# Patient Record
Sex: Female | Born: 1985 | Race: White | Hispanic: No | Smoking: Former smoker
Health system: Southern US, Community
[De-identification: ages and names within clinical notes are randomized; demographics above are authoritative.]

## PROBLEM LIST (undated history)

## (undated) ENCOUNTER — Inpatient Hospital Stay (HOSPITAL_COMMUNITY): Payer: Self-pay

## (undated) DIAGNOSIS — F32A Depression, unspecified: Secondary | ICD-10-CM

## (undated) DIAGNOSIS — F1010 Alcohol abuse, uncomplicated: Secondary | ICD-10-CM

## (undated) DIAGNOSIS — F419 Anxiety disorder, unspecified: Secondary | ICD-10-CM

## (undated) DIAGNOSIS — R87629 Unspecified abnormal cytological findings in specimens from vagina: Secondary | ICD-10-CM

## (undated) DIAGNOSIS — B9689 Other specified bacterial agents as the cause of diseases classified elsewhere: Secondary | ICD-10-CM

## (undated) DIAGNOSIS — F1910 Other psychoactive substance abuse, uncomplicated: Secondary | ICD-10-CM

## (undated) DIAGNOSIS — F329 Major depressive disorder, single episode, unspecified: Secondary | ICD-10-CM

## (undated) DIAGNOSIS — N390 Urinary tract infection, site not specified: Secondary | ICD-10-CM

## (undated) DIAGNOSIS — N760 Acute vaginitis: Secondary | ICD-10-CM

## (undated) HISTORY — PX: COLONOSCOPY: SHX174

## (undated) HISTORY — DX: Depression, unspecified: F32.A

## (undated) HISTORY — DX: Urinary tract infection, site not specified: N39.0

## (undated) HISTORY — PX: OTHER SURGICAL HISTORY: SHX169

## (undated) HISTORY — PX: WISDOM TOOTH EXTRACTION: SHX21

## (undated) HISTORY — DX: Major depressive disorder, single episode, unspecified: F32.9

## (undated) HISTORY — DX: Acute vaginitis: N76.0

## (undated) HISTORY — DX: Anxiety disorder, unspecified: F41.9

## (undated) HISTORY — DX: Unspecified abnormal cytological findings in specimens from vagina: R87.629

## (undated) HISTORY — DX: Other specified bacterial agents as the cause of diseases classified elsewhere: B96.89

---
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
## 2007-08-06 ENCOUNTER — Other Ambulatory Visit: Admission: RE | Admit: 2007-08-06 | Discharge: 2007-08-06 | Payer: Self-pay | Admitting: Gynecology

## 2008-03-08 ENCOUNTER — Other Ambulatory Visit: Admission: RE | Admit: 2008-03-08 | Discharge: 2008-03-08 | Payer: Self-pay | Admitting: Gynecology

## 2008-03-08 ENCOUNTER — Ambulatory Visit: Payer: Self-pay | Admitting: Gynecology

## 2008-03-08 ENCOUNTER — Encounter: Payer: Self-pay | Admitting: Gynecology

## 2008-04-14 ENCOUNTER — Emergency Department (HOSPITAL_COMMUNITY): Admission: EM | Admit: 2008-04-14 | Discharge: 2008-04-14 | Payer: Self-pay | Admitting: Emergency Medicine

## 2008-07-13 ENCOUNTER — Ambulatory Visit: Payer: Self-pay | Admitting: Gynecology

## 2010-06-18 LAB — URINALYSIS, ROUTINE W REFLEX MICROSCOPIC
Bilirubin Urine: NEGATIVE
Glucose, UA: NEGATIVE mg/dL
Hgb urine dipstick: NEGATIVE
Ketones, ur: NEGATIVE mg/dL
Nitrite: NEGATIVE
Protein, ur: NEGATIVE mg/dL
Specific Gravity, Urine: 1 — ABNORMAL LOW (ref 1.005–1.030)
Urobilinogen, UA: 0.2 mg/dL (ref 0.0–1.0)
pH: 6 (ref 5.0–8.0)

## 2010-06-18 LAB — RAPID URINE DRUG SCREEN, HOSP PERFORMED
Amphetamines: NOT DETECTED
Barbiturates: NOT DETECTED
Benzodiazepines: NOT DETECTED
Cocaine: NOT DETECTED
Opiates: NOT DETECTED
Tetrahydrocannabinol: NOT DETECTED

## 2010-06-18 LAB — CBC
HCT: 41.7 % (ref 36.0–46.0)
Hemoglobin: 14.3 g/dL (ref 12.0–15.0)
MCHC: 34.2 g/dL (ref 30.0–36.0)
MCV: 90.6 fL (ref 78.0–100.0)
Platelets: 177 10*3/uL (ref 150–400)
RBC: 4.61 MIL/uL (ref 3.87–5.11)
RDW: 12 % (ref 11.5–15.5)
WBC: 6.9 10*3/uL (ref 4.0–10.5)

## 2010-06-18 LAB — POCT I-STAT, CHEM 8
BUN: 9 mg/dL (ref 6–23)
Calcium, Ion: 0.95 mmol/L — ABNORMAL LOW (ref 1.12–1.32)
Chloride: 113 mEq/L — ABNORMAL HIGH (ref 96–112)
Creatinine, Ser: 0.8 mg/dL (ref 0.4–1.2)
Glucose, Bld: 100 mg/dL — ABNORMAL HIGH (ref 70–99)
HCT: 42 % (ref 36.0–46.0)
Hemoglobin: 14.3 g/dL (ref 12.0–15.0)
Potassium: 4.7 mEq/L (ref 3.5–5.1)
Sodium: 142 mEq/L (ref 135–145)
TCO2: 16 mmol/L (ref 0–100)

## 2010-06-18 LAB — DIFFERENTIAL
Basophils Absolute: 0 10*3/uL (ref 0.0–0.1)
Basophils Relative: 1 % (ref 0–1)
Eosinophils Absolute: 0 10*3/uL (ref 0.0–0.7)
Eosinophils Relative: 1 % (ref 0–5)
Lymphocytes Relative: 37 % (ref 12–46)
Lymphs Abs: 2.5 10*3/uL (ref 0.7–4.0)
Monocytes Absolute: 0.5 10*3/uL (ref 0.1–1.0)
Monocytes Relative: 8 % (ref 3–12)
Neutro Abs: 3.7 10*3/uL (ref 1.7–7.7)
Neutrophils Relative %: 54 % (ref 43–77)

## 2010-06-18 LAB — URINE MICROSCOPIC-ADD ON

## 2010-06-18 LAB — POCT PREGNANCY, URINE: Preg Test, Ur: NEGATIVE

## 2010-06-18 LAB — ETHANOL: Alcohol, Ethyl (B): 240 mg/dL — ABNORMAL HIGH (ref 0–10)

## 2012-09-10 ENCOUNTER — Ambulatory Visit: Payer: Self-pay | Admitting: Women's Health

## 2013-08-20 ENCOUNTER — Ambulatory Visit (INDEPENDENT_AMBULATORY_CARE_PROVIDER_SITE_OTHER): Payer: Federal, State, Local not specified - PPO | Admitting: Family Medicine

## 2013-08-20 VITALS — BP 120/80 | HR 99 | Temp 97.8°F | Resp 16 | Ht 72.0 in | Wt 149.0 lb

## 2013-08-20 DIAGNOSIS — N9489 Other specified conditions associated with female genital organs and menstrual cycle: Secondary | ICD-10-CM

## 2013-08-20 DIAGNOSIS — N898 Other specified noninflammatory disorders of vagina: Secondary | ICD-10-CM

## 2013-08-20 NOTE — Progress Notes (Signed)
   Subjective:    Patient ID: Haley Parks, female    DOB: [birth year], 28 y.o.   MRN: [id number] Chief Complaint  Patient presents with  . Foreign Body in Vagina    Tampon possibly    HPI  Was out drinking last night and thinks she forgot to take her tampon out before she put another one in but she is not sure.  Some brown menstrual blood and odor but no f/c/abd pain/vag d/c/ pelvic pain, etc.  History reviewed. No pertinent past medical history. No current outpatient prescriptions on file prior to visit.   No current facility-administered medications on file prior to visit.   No Known Allergies   Review of Systems  Constitutional: Negative for fever, chills, diaphoresis, activity change, appetite change, fatigue and unexpected weight change.  Gastrointestinal: Negative for abdominal pain, diarrhea, constipation, blood in stool, anal bleeding and rectal pain.  Genitourinary: Positive for vaginal bleeding. Negative for dysuria, urgency, frequency, hematuria, decreased urine volume, vaginal discharge, difficulty urinating, genital sores, vaginal pain, menstrual problem, pelvic pain and dyspareunia.  Musculoskeletal: Negative for gait problem.  Skin: Negative for rash.  Hematological: Negative for adenopathy.  Psychiatric/Behavioral: The patient is not nervous/anxious.       BP 120/80  Pulse 99  Temp(Src) 97.8 F (36.6 C) (Oral)  Resp 16  Ht 6' (1.829 m)  Wt 149 lb (67.586 kg)  BMI 20.20 kg/m2  SpO2 97%  LMP 08/18/2013 Objective:   Physical Exam  Constitutional: She is oriented to person, place, and time. She appears well-developed and well-nourished. No distress.  HENT:  Head: Normocephalic and atraumatic.  Right Ear: External ear normal.  Eyes: Conjunctivae are normal. No scleral icterus.  Pulmonary/Chest: Effort normal.  Genitourinary: Vagina normal. There is no rash, tenderness or lesion on the right labia. There is no rash, tenderness or lesion on the left labia. No  tenderness around the vagina. No foreign body around the vagina. No vaginal discharge found.  Neurological: She is alert and oriented to person, place, and time.  Skin: Skin is warm and dry. She is not diaphoretic. No erythema.  Psychiatric: She has a normal mood and affect. Her behavior is normal.          Assessment & Plan:  Vaginal odor Concerned about possibility of tampon left in vagina but on speculum and bimanual exam - no abnormality or FB. Pt very reassured. No orders of the defined types were placed in this encounter.    I personally performed the services described in this documentation, which was scribed in my presence. The recorded information has been reviewed and considered, and addended by me as needed.  Norberto SorensonEva Shaw, MD MPH

## 2013-11-15 ENCOUNTER — Ambulatory Visit (INDEPENDENT_AMBULATORY_CARE_PROVIDER_SITE_OTHER): Payer: BC Managed Care – PPO | Admitting: Psychiatry

## 2013-11-15 ENCOUNTER — Encounter (HOSPITAL_COMMUNITY): Payer: Self-pay | Admitting: Psychiatry

## 2013-11-15 VITALS — BP 131/85 | HR 75 | Ht 71.0 in | Wt 158.2 lb

## 2013-11-15 DIAGNOSIS — F331 Major depressive disorder, recurrent, moderate: Secondary | ICD-10-CM | POA: Insufficient documentation

## 2013-11-15 DIAGNOSIS — F329 Major depressive disorder, single episode, unspecified: Secondary | ICD-10-CM

## 2013-11-15 DIAGNOSIS — F1121 Opioid dependence, in remission: Secondary | ICD-10-CM

## 2013-11-15 DIAGNOSIS — F1521 Other stimulant dependence, in remission: Secondary | ICD-10-CM

## 2013-11-15 DIAGNOSIS — O99330 Smoking (tobacco) complicating pregnancy, unspecified trimester: Secondary | ICD-10-CM | POA: Insufficient documentation

## 2013-11-15 DIAGNOSIS — F411 Generalized anxiety disorder: Secondary | ICD-10-CM | POA: Insufficient documentation

## 2013-11-15 DIAGNOSIS — F17200 Nicotine dependence, unspecified, uncomplicated: Secondary | ICD-10-CM

## 2013-11-15 DIAGNOSIS — F4310 Post-traumatic stress disorder, unspecified: Secondary | ICD-10-CM

## 2013-11-15 DIAGNOSIS — F17210 Nicotine dependence, cigarettes, uncomplicated: Secondary | ICD-10-CM

## 2013-11-15 MED ORDER — MIRTAZAPINE 30 MG PO TABS: 30.0000 mg | ORAL_TABLET | Freq: Every day | ORAL | Status: DC

## 2013-11-15 MED ORDER — PROPRANOLOL HCL 10 MG PO TABS: ORAL_TABLET | ORAL | Status: DC

## 2013-11-15 MED ORDER — GABAPENTIN 300 MG PO CAPS: 300.0000 mg | ORAL_CAPSULE | Freq: Three times a day (TID) | ORAL | Status: DC
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
## 2014-01-24 ENCOUNTER — Inpatient Hospital Stay (HOSPITAL_COMMUNITY)
Admission: EM | Admit: 2014-01-24 | Discharge: 2014-01-25 | DRG: 089 | Discharge status: Against Medical Advice | Payer: No Typology Code available for payment source | Attending: General Surgery | Admitting: General Surgery

## 2014-01-24 ENCOUNTER — Encounter (HOSPITAL_COMMUNITY): Payer: Self-pay | Admitting: Neurology

## 2014-01-24 ENCOUNTER — Emergency Department (HOSPITAL_COMMUNITY): Payer: No Typology Code available for payment source

## 2014-01-24 DIAGNOSIS — G5601 Carpal tunnel syndrome, right upper limb: Secondary | ICD-10-CM | POA: Diagnosis present

## 2014-01-24 DIAGNOSIS — Y92410 Unspecified street and highway as the place of occurrence of the external cause: Secondary | ICD-10-CM | POA: Diagnosis not present

## 2014-01-24 DIAGNOSIS — S060X0A Concussion without loss of consciousness, initial encounter: Secondary | ICD-10-CM | POA: Diagnosis present

## 2014-01-24 DIAGNOSIS — J9600 Acute respiratory failure, unspecified whether with hypoxia or hypercapnia: Secondary | ICD-10-CM | POA: Diagnosis present

## 2014-01-24 DIAGNOSIS — N179 Acute kidney failure, unspecified: Secondary | ICD-10-CM | POA: Diagnosis present

## 2014-01-24 DIAGNOSIS — R4182 Altered mental status, unspecified: Secondary | ICD-10-CM | POA: Diagnosis present

## 2014-01-24 DIAGNOSIS — F10929 Alcohol use, unspecified with intoxication, unspecified: Secondary | ICD-10-CM | POA: Diagnosis present

## 2014-01-24 DIAGNOSIS — R40243 Glasgow coma scale score 3-8, unspecified time: Secondary | ICD-10-CM

## 2014-01-24 LAB — I-STAT CHEM 8, ED
BUN: 6 mg/dL (ref 6–23)
Calcium, Ion: 1.18 mmol/L (ref 1.12–1.23)
Chloride: 103 mEq/L (ref 96–112)
Creatinine, Ser: 1.1 mg/dL (ref 0.50–1.10)
Glucose, Bld: 84 mg/dL (ref 70–99)
HCT: 43 % (ref 36.0–46.0)
Hemoglobin: 14.6 g/dL (ref 12.0–15.0)
Potassium: 3.4 mEq/L — ABNORMAL LOW (ref 3.7–5.3)
Sodium: 142 mEq/L (ref 137–147)
TCO2: 23 mmol/L (ref 0–100)

## 2014-01-24 LAB — URINALYSIS, ROUTINE W REFLEX MICROSCOPIC
Bilirubin Urine: NEGATIVE
Glucose, UA: NEGATIVE mg/dL
Hgb urine dipstick: NEGATIVE
Ketones, ur: NEGATIVE mg/dL
Leukocytes, UA: NEGATIVE
Nitrite: NEGATIVE
Protein, ur: NEGATIVE mg/dL
Specific Gravity, Urine: 1.01 (ref 1.005–1.030)
Urobilinogen, UA: 0.2 mg/dL (ref 0.0–1.0)
pH: 7 (ref 5.0–8.0)

## 2014-01-24 LAB — COMPREHENSIVE METABOLIC PANEL
ALT: 16 U/L (ref 0–35)
AST: 30 U/L (ref 0–37)
Albumin: 4.2 g/dL (ref 3.5–5.2)
Alkaline Phosphatase: 55 U/L (ref 39–117)
Anion gap: 15 (ref 5–15)
BUN: 8 mg/dL (ref 6–23)
CO2: 25 mEq/L (ref 19–32)
Calcium: 9.4 mg/dL (ref 8.4–10.5)
Chloride: 104 mEq/L (ref 96–112)
Creatinine, Ser: 0.84 mg/dL (ref 0.50–1.10)
GFR calc Af Amer: >90 mL/min (ref >90)
GFR calc non Af Amer: >90 mL/min (ref >90)
Glucose, Bld: 81 mg/dL (ref 70–99)
Potassium: 3.5 mEq/L — ABNORMAL LOW (ref 3.7–5.3)
Sodium: 144 mEq/L (ref 137–147)
Total Bilirubin: 0.5 mg/dL (ref 0.3–1.2)
Total Protein: 7.4 g/dL (ref 6.0–8.3)

## 2014-01-24 LAB — PREGNANCY, URINE: Preg Test, Ur: NEGATIVE

## 2014-01-24 LAB — CBC
HCT: 40.2 % (ref 36.0–46.0)
Hemoglobin: 14.1 g/dL (ref 12.0–15.0)
MCH: 32.2 pg (ref 26.0–34.0)
MCHC: 35.1 g/dL (ref 30.0–36.0)
MCV: 91.8 fL (ref 78.0–100.0)
Platelets: 179 10*3/uL (ref 150–400)
RBC: 4.38 MIL/uL (ref 3.87–5.11)
RDW: 11.3 % — ABNORMAL LOW (ref 11.5–15.5)
WBC: 6.4 10*3/uL (ref 4.0–10.5)

## 2014-01-24 LAB — I-STAT ARTERIAL BLOOD GAS, ED
Acid-base deficit: 2 mmol/L (ref 0.0–2.0)
Bicarbonate: 24 mEq/L (ref 20.0–24.0)
O2 Saturation: 100 %
Patient temperature: 98.6
TCO2: 25 mmol/L (ref 0–100)
pCO2 arterial: 43.9 mmHg (ref 35.0–45.0)
pH, Arterial: 7.345 — ABNORMAL LOW (ref 7.350–7.450)
pO2, Arterial: 195 mmHg — ABNORMAL HIGH (ref 80.0–100.0)

## 2014-01-24 LAB — MRSA PCR SCREENING: MRSA by PCR: NEGATIVE

## 2014-01-24 LAB — RAPID URINE DRUG SCREEN, HOSP PERFORMED
Amphetamines: NOT DETECTED
Barbiturates: NOT DETECTED
Benzodiazepines: NOT DETECTED
Cocaine: NOT DETECTED
Opiates: NOT DETECTED
Tetrahydrocannabinol: NOT DETECTED

## 2014-01-24 LAB — ABO/RH: ABO/RH(D): O POS

## 2014-01-24 LAB — CDS SEROLOGY

## 2014-01-24 LAB — ETHANOL: Alcohol, Ethyl (B): 151 mg/dL — ABNORMAL HIGH (ref 0–11)

## 2014-01-24 LAB — PROTIME-INR
INR: 1.03 (ref 0.00–1.49)
Prothrombin Time: 13.6 seconds (ref 11.6–15.2)

## 2014-01-24 LAB — LACTIC ACID, PLASMA: Lactic Acid, Venous: 1.1 mmol/L (ref 0.5–2.2)

## 2014-01-24 LAB — TRIGLYCERIDES: Triglycerides: 109 mg/dL (ref <150)

## 2014-01-24 MED ORDER — ETOMIDATE 2 MG/ML IV SOLN
INTRAVENOUS | Status: AC | PRN
  Administered 2014-01-24: 20 mg via INTRAVENOUS

## 2014-01-24 MED ORDER — FENTANYL CITRATE 0.05 MG/ML IJ SOLN
INTRAMUSCULAR | Status: DC
Start: 2014-01-24 — End: 2014-01-24
  Filled 2014-01-24: qty 2

## 2014-01-24 MED ORDER — FENTANYL CITRATE 0.05 MG/ML IJ SOLN
INTRAMUSCULAR | Status: AC | PRN
  Administered 2014-01-24: 100 ug via INTRAVENOUS

## 2014-01-24 MED ORDER — PROPOFOL 10 MG/ML IV EMUL
5.0000 ug/kg/min | Freq: Once | INTRAVENOUS | Status: DC
  Administered 2014-01-24: 10 ug/kg/min via INTRAVENOUS

## 2014-01-24 MED ORDER — ENOXAPARIN SODIUM 40 MG/0.4ML ~~LOC~~ SOLN
40.0000 mg | SUBCUTANEOUS | Status: DC
  Administered 2014-01-24: 40 mg via SUBCUTANEOUS
  Filled 2014-01-24 (×2): qty 0.4

## 2014-01-24 MED ORDER — FENTANYL CITRATE 0.05 MG/ML IJ SOLN
INTRAMUSCULAR | Status: AC
  Filled 2014-01-24: qty 2

## 2014-01-24 MED ORDER — PROPOFOL 10 MG/ML IV EMUL
0.0000 ug/kg/min | INTRAVENOUS | Status: DC
  Administered 2014-01-24: 49.057 ug/kg/min via INTRAVENOUS

## 2014-01-24 MED ORDER — POTASSIUM CHLORIDE IN NACL 20-0.45 MEQ/L-% IV SOLN
INTRAVENOUS | Status: DC
  Administered 2014-01-24: 75 mL/h via INTRAVENOUS
  Filled 2014-01-24 (×3): qty 1000

## 2014-01-24 MED ORDER — DOCUSATE SODIUM 100 MG PO CAPS
100.0000 mg | ORAL_CAPSULE | Freq: Two times a day (BID) | ORAL | Status: DC
  Filled 2014-01-24 (×3): qty 1

## 2014-01-24 MED ORDER — PROPOFOL 10 MG/ML IV EMUL
INTRAVENOUS | Status: AC
  Filled 2014-01-24: qty 100

## 2014-01-24 MED ORDER — CETYLPYRIDINIUM CHLORIDE 0.05 % MT LIQD: 7.0000 mL | Freq: Four times a day (QID) | OROMUCOSAL | Status: DC

## 2014-01-24 MED ORDER — ONDANSETRON HCL 4 MG PO TABS
4.0000 mg | ORAL_TABLET | Freq: Four times a day (QID) | ORAL | Status: DC | PRN
Start: 2014-01-24 — End: 2014-01-25

## 2014-01-24 MED ORDER — PROPOFOL 10 MG/ML IV EMUL
5.0000 ug/kg/min | Freq: Once | INTRAVENOUS | Status: DC
  Filled 2014-01-24: qty 100

## 2014-01-24 MED ORDER — ONDANSETRON HCL 4 MG/2ML IJ SOLN: 4.0000 mg | Freq: Four times a day (QID) | INTRAMUSCULAR | Status: DC | PRN

## 2014-01-24 MED ORDER — LORAZEPAM 2 MG/ML IJ SOLN
INTRAMUSCULAR | Status: AC | PRN
  Administered 2014-01-24: 2 mg via INTRAVENOUS

## 2014-01-24 MED ORDER — FENTANYL CITRATE 0.05 MG/ML IJ SOLN
100.0000 ug | INTRAMUSCULAR | Status: DC | PRN
  Administered 2014-01-24 – 2014-01-25 (×3): 100 ug via INTRAVENOUS
  Filled 2014-01-24 (×3): qty 2

## 2014-01-24 MED ORDER — CHLORHEXIDINE GLUCONATE 0.12 % MT SOLN
15.0000 mL | Freq: Two times a day (BID) | OROMUCOSAL | Status: DC
  Administered 2014-01-24: 15 mL via OROMUCOSAL
  Filled 2014-01-24: qty 15

## 2014-01-24 MED ORDER — ROCURONIUM BROMIDE 50 MG/5ML IV SOLN
INTRAVENOUS | Status: AC | PRN
  Administered 2014-01-24: 60 mg via INTRAVENOUS

## 2014-01-24 MED ORDER — IOHEXOL 300 MG/ML  SOLN
80.0000 mL | Freq: Once | INTRAMUSCULAR | Status: AC | PRN
  Administered 2014-01-24: 80 mL via INTRAVENOUS

## 2014-01-24 MED ORDER — CETYLPYRIDINIUM CHLORIDE 0.05 % MT LIQD
7.0000 mL | Freq: Two times a day (BID) | OROMUCOSAL | Status: DC
  Administered 2014-01-24: 7 mL via OROMUCOSAL

## 2014-01-24 MED ORDER — PANTOPRAZOLE SODIUM 40 MG IV SOLR
40.0000 mg | Freq: Every day | INTRAVENOUS | Status: DC
  Administered 2014-01-24: 40 mg via INTRAVENOUS
  Filled 2014-01-24 (×2): qty 40

## 2014-01-24 MED ORDER — LORAZEPAM 2 MG/ML IJ SOLN
INTRAMUSCULAR | Status: AC
  Filled 2014-01-24: qty 1

## 2014-01-24 MED ORDER — PANTOPRAZOLE SODIUM 40 MG PO TBEC: 40.0000 mg | DELAYED_RELEASE_TABLET | Freq: Every day | ORAL | Status: DC

## 2014-01-24 NOTE — ED Notes (Signed)
Dr. Juleen ChinaKohut preparing to intubate patient.
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
## 2014-01-24 NOTE — Progress Notes (Signed)
Patient intubated by ED physician with a 7.5 ETT placed at 21 @ lip good color change on endtidal CO2 detector, good clear BBS, chest X-Ray confirmed placement.

## 2014-01-24 NOTE — Procedures (Signed)
Extubation Procedure Note  Patient Details:   Name: Lowella PettiesLiana M Wickersham DOB: 1986-01-13 MRN: [id number]   Airway Documentation:  Airway 7.5 mm (Active)  Secured at (cm) 22 cm 01/24/2014  8:51 PM  Measured From Lips 01/24/2014  8:51 PM  Secured Location Center 01/24/2014  8:51 PM  Secured By [employer] Tube Holder 01/24/2014  8:51 PM  Tube Holder Repositioned Yes 01/24/2014  8:51 PM  Cuff Pressure (cm H2O) 28 cm H2O 01/24/2014  1:30 PM  Site Condition Dry 01/24/2014  8:51 PM    Evaluation  O2 sats: stable throughout Complications: No apparent complications Patient did tolerate procedure well. Bilateral Breath Sounds: Clear, Diminished Suctioning: Airway Yes   Patient had a cuff leak and  NIF -30 & VC 817mL.  Patient was extubated to a 4L [place] with no stridor noted.  Epifanio LeschesSarpy, Elexis Pollak A 01/24/2014, 9:26 PM
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
## 2014-01-24 NOTE — Progress Notes (Signed)
Patient received from the ED vented, on diprivan gtt @ 30 mcg/kg/min-increased to 2750mcg/kg/min s/tincreased agitation and attempting to pull ETT, bilateral safety mittens applied with patient still attempting to pull ETT, bilateral wrist restraints applied per order from Bryn AthynJeffery, PA-C. Patient nodded head appropriately to verbal stimuli. See flowsheet for full assessment. Nursing to continue to monitor.
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
## 2014-01-25 LAB — CBC
HEMATOCRIT: 38.4 % (ref 36.0–46.0)
HEMOGLOBIN: 13.1 g/dL (ref 12.0–15.0)
MCH: 31.6 pg (ref 26.0–34.0)
MCHC: 34.1 g/dL (ref 30.0–36.0)
MCV: 92.5 fL (ref 78.0–100.0)
Platelets: 153 10*3/uL (ref 150–400)
RBC: 4.15 MIL/uL (ref 3.87–5.11)
RDW: 11.6 % (ref 11.5–15.5)
WBC: 7.7 10*3/uL (ref 4.0–10.5)

## 2014-01-25 LAB — BASIC METABOLIC PANEL
Anion gap: 14 (ref 5–15)
BUN: 9 mg/dL (ref 6–23)
CALCIUM: 9 mg/dL (ref 8.4–10.5)
CO2: 23 mEq/L (ref 19–32)
CREATININE: 0.81 mg/dL (ref 0.50–1.10)
Chloride: 105 mEq/L (ref 96–112)
GFR calc Af Amer: >90 mL/min (ref >90)
GLUCOSE: 72 mg/dL (ref 70–99)
Potassium: 4.1 mEq/L (ref 3.7–5.3)
Sodium: 142 mEq/L (ref 137–147)

## 2014-01-25 LAB — BLOOD PRODUCT ORDER (VERBAL) VERIFICATION

## 2014-01-25 NOTE — Clinical Social Work Note (Signed)
Clinical Social Worker received referral for continued substance use.  Patient has opted to leave AMA at this time.  Patient mother present to provide transportation.  CSW available as needed.  No SBIRT completed due to patient desire to leave immediately.  Macario GoldsJesse Mystie Ormand, Kentucky[place]CS[place] [phone]

## 2014-01-25 NOTE — Progress Notes (Signed)
Pt states she is no longer going to wait for MD and wants to go home.  She has been advised of the risks and has signed AMA forms.  All of her belongings have been returned and signed for.

## 2014-01-25 NOTE — Progress Notes (Signed)
UR completed.  Katrena Stehlin, RN BSN [facility] CCM Trauma/Neuro ICU Case Manager [phone]  

## 2014-01-27 LAB — PREPARE FRESH FROZEN PLASMA
Unit division: 0
Unit division: 0

## 2014-01-27 LAB — TYPE AND SCREEN
ABO/RH(D): O POS
Antibody Screen: NEGATIVE
Unit division: 0
Unit division: 0
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
## 2014-02-02 DIAGNOSIS — F10929 Alcohol use, unspecified with intoxication, unspecified: Secondary | ICD-10-CM | POA: Diagnosis present

## 2014-02-02 DIAGNOSIS — J9600 Acute respiratory failure, unspecified whether with hypoxia or hypercapnia: Secondary | ICD-10-CM | POA: Diagnosis present
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
## 2014-02-07 ENCOUNTER — Ambulatory Visit (HOSPITAL_COMMUNITY): Payer: Self-pay | Admitting: Psychiatry

## 2014-02-07 ENCOUNTER — Encounter (HOSPITAL_COMMUNITY): Payer: Self-pay | Admitting: Psychiatry

## 2014-05-01 ENCOUNTER — Other Ambulatory Visit (HOSPITAL_COMMUNITY): Payer: Self-pay | Admitting: Psychiatry

## 2014-05-02 NOTE — Telephone Encounter (Signed)
Pt last seen in Sept 2015. She must schedule a follow up to get any refills.

## 2014-05-02 NOTE — Telephone Encounter (Signed)
Left message on voice mail for patient to call the office back.  Patient will need an office visit scheduled with Dr. Michae KavaAgarwal for further refills.  Notified [facility] as well.

## 2014-05-12 ENCOUNTER — Other Ambulatory Visit (HOSPITAL_COMMUNITY): Payer: Self-pay | Admitting: Psychiatry

## 2014-05-12 NOTE — Telephone Encounter (Signed)
Patient's Inderal refill denied at this time as patient last evaluated 11/15/13, no showed 02/07/14 and currently has no appointments scheduled.

## 2014-07-11 ENCOUNTER — Ambulatory Visit (HOSPITAL_COMMUNITY): Payer: Self-pay | Admitting: Psychiatry

## 2014-09-05 ENCOUNTER — Emergency Department (HOSPITAL_COMMUNITY)
Admission: EM | Admit: 2014-09-05 | Discharge: 2014-09-05 | Disposition: A | Discharge status: Other Acute Inpatient Hospital | Payer: Self-pay | Attending: Emergency Medicine | Admitting: Emergency Medicine

## 2014-09-05 ENCOUNTER — Encounter (HOSPITAL_COMMUNITY): Payer: Self-pay

## 2014-09-05 ENCOUNTER — Encounter (HOSPITAL_COMMUNITY): Payer: Self-pay | Admitting: Emergency Medicine

## 2014-09-05 ENCOUNTER — Inpatient Hospital Stay (HOSPITAL_COMMUNITY)
Admission: RE | Admit: 2014-09-05 | Discharge: 2014-09-08 | DRG: 897 | Disposition: A | Discharge status: Home / Self Care | Payer: Federal, State, Local not specified - Other | Attending: Psychiatry | Admitting: Psychiatry

## 2014-09-05 DIAGNOSIS — G4700 Insomnia, unspecified: Secondary | ICD-10-CM | POA: Diagnosis present

## 2014-09-05 DIAGNOSIS — F419 Anxiety disorder, unspecified: Secondary | ICD-10-CM | POA: Insufficient documentation

## 2014-09-05 DIAGNOSIS — F4310 Post-traumatic stress disorder, unspecified: Secondary | ICD-10-CM | POA: Diagnosis present

## 2014-09-05 DIAGNOSIS — F1121 Opioid dependence, in remission: Secondary | ICD-10-CM | POA: Diagnosis present

## 2014-09-05 DIAGNOSIS — F331 Major depressive disorder, recurrent, moderate: Secondary | ICD-10-CM | POA: Diagnosis present

## 2014-09-05 DIAGNOSIS — F10920 Alcohol use, unspecified with intoxication, uncomplicated: Secondary | ICD-10-CM

## 2014-09-05 DIAGNOSIS — Z3202 Encounter for pregnancy test, result negative: Secondary | ICD-10-CM | POA: Insufficient documentation

## 2014-09-05 DIAGNOSIS — Z79899 Other long term (current) drug therapy: Secondary | ICD-10-CM | POA: Insufficient documentation

## 2014-09-05 DIAGNOSIS — Z87440 Personal history of urinary (tract) infections: Secondary | ICD-10-CM | POA: Insufficient documentation

## 2014-09-05 DIAGNOSIS — F1410 Cocaine abuse, uncomplicated: Secondary | ICD-10-CM | POA: Insufficient documentation

## 2014-09-05 DIAGNOSIS — R45851 Suicidal ideations: Secondary | ICD-10-CM

## 2014-09-05 DIAGNOSIS — F329 Major depressive disorder, single episode, unspecified: Secondary | ICD-10-CM | POA: Insufficient documentation

## 2014-09-05 DIAGNOSIS — F411 Generalized anxiety disorder: Secondary | ICD-10-CM | POA: Diagnosis present

## 2014-09-05 DIAGNOSIS — R000 Tachycardia, unspecified: Secondary | ICD-10-CM | POA: Insufficient documentation

## 2014-09-05 DIAGNOSIS — Z720 Tobacco use: Secondary | ICD-10-CM | POA: Insufficient documentation

## 2014-09-05 DIAGNOSIS — F17210 Nicotine dependence, cigarettes, uncomplicated: Secondary | ICD-10-CM | POA: Diagnosis present

## 2014-09-05 DIAGNOSIS — F1020 Alcohol dependence, uncomplicated: Principal | ICD-10-CM | POA: Diagnosis present

## 2014-09-05 DIAGNOSIS — F10120 Alcohol abuse with intoxication, uncomplicated: Secondary | ICD-10-CM | POA: Insufficient documentation

## 2014-09-05 HISTORY — DX: Alcohol abuse, uncomplicated: F10.10

## 2014-09-05 HISTORY — DX: Other psychoactive substance abuse, uncomplicated: F19.10

## 2014-09-05 LAB — CBC WITH DIFFERENTIAL/PLATELET
Basophils Absolute: 0 10*3/uL (ref 0.0–0.1)
Basophils Relative: 0 % (ref 0–1)
Eosinophils Absolute: 0.1 10*3/uL (ref 0.0–0.7)
Eosinophils Relative: 1 % (ref 0–5)
HCT: 44.5 % (ref 36.0–46.0)
Hemoglobin: 15.4 g/dL — ABNORMAL HIGH (ref 12.0–15.0)
Lymphocytes Relative: 28 % (ref 12–46)
Lymphs Abs: 2.6 10*3/uL (ref 0.7–4.0)
MCH: 32.5 pg (ref 26.0–34.0)
MCHC: 34.6 g/dL (ref 30.0–36.0)
MCV: 93.9 fL (ref 78.0–100.0)
Monocytes Absolute: 0.5 10*3/uL (ref 0.1–1.0)
Monocytes Relative: 5 % (ref 3–12)
Neutro Abs: 5.9 10*3/uL (ref 1.7–7.7)
Neutrophils Relative %: 66 % (ref 43–77)
Platelets: 197 10*3/uL (ref 150–400)
RBC: 4.74 MIL/uL (ref 3.87–5.11)
RDW: 13.1 % (ref 11.5–15.5)
WBC: 9.1 10*3/uL (ref 4.0–10.5)

## 2014-09-05 LAB — RAPID URINE DRUG SCREEN, HOSP PERFORMED
Amphetamines: NOT DETECTED
Barbiturates: NOT DETECTED
Benzodiazepines: NOT DETECTED
Cocaine: POSITIVE — AB
Opiates: NOT DETECTED
Tetrahydrocannabinol: NOT DETECTED

## 2014-09-05 LAB — COMPREHENSIVE METABOLIC PANEL
ALT: 13 U/L — ABNORMAL LOW (ref 14–54)
AST: 16 U/L (ref 15–41)
Albumin: 4.5 g/dL (ref 3.5–5.0)
Alkaline Phosphatase: 55 U/L (ref 38–126)
Anion gap: 10 (ref 5–15)
BUN: 8 mg/dL (ref 6–20)
CO2: 27 mmol/L (ref 22–32)
Calcium: 9.4 mg/dL (ref 8.9–10.3)
Chloride: 108 mmol/L (ref 101–111)
Creatinine, Ser: 0.84 mg/dL (ref 0.44–1.00)
GFR calc Af Amer: >60 mL/min (ref >60)
GFR calc non Af Amer: >60 mL/min (ref >60)
Glucose, Bld: 99 mg/dL (ref 65–99)
Potassium: 3.9 mmol/L (ref 3.5–5.1)
Sodium: 145 mmol/L (ref 135–145)
Total Bilirubin: 0.5 mg/dL (ref 0.3–1.2)
Total Protein: 7.8 g/dL (ref 6.5–8.1)

## 2014-09-05 LAB — I-STAT BETA HCG BLOOD, ED (MC, WL, AP ONLY): I-stat hCG, quantitative: <5 m[IU]/mL (ref <5)

## 2014-09-05 LAB — ETHANOL: Alcohol, Ethyl (B): 167 mg/dL — ABNORMAL HIGH (ref <5)

## 2014-09-05 MED ORDER — ONDANSETRON 4 MG PO TBDP: 4.0000 mg | ORAL_TABLET | Freq: Four times a day (QID) | ORAL | Status: DC | PRN

## 2014-09-05 MED ORDER — CHLORDIAZEPOXIDE HCL 25 MG PO CAPS
25.0000 mg | ORAL_CAPSULE | Freq: Four times a day (QID) | ORAL | Status: AC
  Administered 2014-09-05 – 2014-09-06 (×6): 25 mg via ORAL
  Filled 2014-09-05 (×6): qty 1

## 2014-09-05 MED ORDER — LOPERAMIDE HCL 2 MG PO CAPS: 2.0000 mg | ORAL_CAPSULE | ORAL | Status: DC | PRN

## 2014-09-05 MED ORDER — CHLORDIAZEPOXIDE HCL 25 MG PO CAPS
25.0000 mg | ORAL_CAPSULE | ORAL | Status: DC
  Administered 2014-09-08: 25 mg via ORAL
  Filled 2014-09-05: qty 1

## 2014-09-05 MED ORDER — VITAMIN B-1 100 MG PO TABS
100.0000 mg | ORAL_TABLET | Freq: Every day | ORAL | Status: DC
  Administered 2014-09-06 – 2014-09-08 (×3): 100 mg via ORAL
  Filled 2014-09-05 (×5): qty 1

## 2014-09-05 MED ORDER — ALUM & MAG HYDROXIDE-SIMETH 200-200-20 MG/5ML PO SUSP: 30.0000 mL | ORAL | Status: DC | PRN

## 2014-09-05 MED ORDER — THIAMINE HCL 100 MG/ML IJ SOLN: 100.0000 mg | Freq: Once | INTRAMUSCULAR | Status: DC

## 2014-09-05 MED ORDER — CHLORDIAZEPOXIDE HCL 25 MG PO CAPS
25.0000 mg | ORAL_CAPSULE | Freq: Three times a day (TID) | ORAL | Status: AC
  Administered 2014-09-07 (×2): 25 mg via ORAL
  Filled 2014-09-05 (×2): qty 1

## 2014-09-05 MED ORDER — CHLORDIAZEPOXIDE HCL 25 MG PO CAPS: 25.0000 mg | ORAL_CAPSULE | Freq: Four times a day (QID) | ORAL | Status: DC | PRN

## 2014-09-05 MED ORDER — NICOTINE 14 MG/24HR TD PT24
14.0000 mg | MEDICATED_PATCH | Freq: Every day | TRANSDERMAL | Status: DC
  Filled 2014-09-05 (×4): qty 1

## 2014-09-05 MED ORDER — MAGNESIUM HYDROXIDE 400 MG/5ML PO SUSP: 30.0000 mL | Freq: Every day | ORAL | Status: DC | PRN

## 2014-09-05 MED ORDER — TRAZODONE HCL 50 MG PO TABS
50.0000 mg | ORAL_TABLET | Freq: Every day | ORAL | Status: DC
  Administered 2014-09-05: 50 mg via ORAL
  Filled 2014-09-05 (×4): qty 1

## 2014-09-05 MED ORDER — CHLORDIAZEPOXIDE HCL 25 MG PO CAPS: 25.0000 mg | ORAL_CAPSULE | Freq: Every day | ORAL | Status: DC

## 2014-09-05 MED ORDER — ACETAMINOPHEN 325 MG PO TABS
650.0000 mg | ORAL_TABLET | Freq: Four times a day (QID) | ORAL | Status: DC | PRN
  Administered 2014-09-06 – 2014-09-08 (×3): 650 mg via ORAL
  Filled 2014-09-05 (×3): qty 2

## 2014-09-05 MED ORDER — HYDROXYZINE HCL 25 MG PO TABS
25.0000 mg | ORAL_TABLET | Freq: Four times a day (QID) | ORAL | Status: DC | PRN
  Administered 2014-09-07: 25 mg via ORAL
  Filled 2014-09-05: qty 1

## 2014-09-05 MED ORDER — ADULT MULTIVITAMIN W/MINERALS CH
1.0000 | ORAL_TABLET | Freq: Every day | ORAL | Status: DC
  Administered 2014-09-05 – 2014-09-08 (×4): 1 via ORAL
  Filled 2014-09-05 (×7): qty 1
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
## 2014-09-05 NOTE — Progress Notes (Signed)
[facility] Group Notes:  (Nursing/MHT/Case Management/Adjunct)  [name] did not attend wrap up group tonight, she was in her room awake. Madaline Savageiamond N Arpita Fentress 09/05/2014, 11:09 PM

## 2014-09-05 NOTE — Tx Team (Addendum)
Initial Interdisciplinary Treatment Plan   PATIENT STRESSORS: Substance abuse Traumatic event   PATIENT STRENGTHS: Average or above average intelligence Capable of independent living Motivation for treatment/growth Work skills   PROBLEM LIST: Problem List/Patient Goals Date to be addressed Date deferred Reason deferred Estimated date of resolution  Feelings of depression 09/05/2014     Feelings of anxiety 09/05/2014     Alcohol consumptions 09/05/2014     Cocaine use 09/05/2014     "I wanna do IOP after I leave here" [facility]     "finish up with this detox from alcohol" [facility]     "stay on my meds" [facility]                  DISCHARGE CRITERIA:  Improved stabilization in mood, thinking, and/or behavior Medical problems require only outpatient monitoring  PRELIMINARY DISCHARGE PLAN: Attend aftercare/continuing care group Outpatient therapy  PATIENT/FAMIILY INVOLVEMENT: This treatment plan has been presented to and reviewed with the patient, Lowella PettiesLiana M Macneill, and/or family member.  The patient and family have been given the opportunity to ask questions and make suggestions.  Maurine SimmeringShugart, Karen M 09/05/2014, 5:56 PM
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
## 2014-09-05 NOTE — ED Notes (Signed)
[facility]. In no acute distress.  Pt's mother took Pt's belongings.

## 2014-09-05 NOTE — ED Notes (Addendum)
Pt stated that she is overwhelmed and feeling anxious. Recent hx of cocaine use, history of chronic alcohol and opioid abuse. [facility]. Mother at bedside. Patient denies SI/HI." I am tired of thinking about ways I can end this"  Mother is concerned that her daughter may harm self, pt did not state a plan. Pt is cooperative and oriented at present. Stated that last used cocaine yesterday
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
## 2014-09-05 NOTE — ED Provider Notes (Signed)
CSN: [id number]     Arrival date & time 09/05/14  0955 History   First MD Initiated Contact with Patient 09/05/14 1015     Chief Complaint  Patient presents with  . Suicidal    [facility]  . Addiction Problem    cocaine     (Consider location/radiation/quality/duration/timing/severity/associated sxs/prior Treatment) HPI Comments: Patient here complaining of increased stress anxiety along with cocaine use. Notes worsening depression with suicidal ideations. Patient has been in drug rehabilitation for the past. Denies command hallucinations. Went to behavior health prior to arrival here has been except for admission. Denies any medical complaints.  The history is provided by the patient and a parent.    Past Medical History  Diagnosis Date  . UTI (lower urinary tract infection)   . Substance abuse   . Alcohol abuse    Past Surgical History  Procedure Laterality Date  . Carpel tunnel     Family History  Problem Relation Age of Onset  . Depression Father   . Drug abuse Father   . Alcohol abuse Father   . Drug abuse Maternal Grandmother   . Suicidality Neg Hx    History  Substance Use Topics  . Smoking status: Current Every Day Smoker    Types: Cigarettes  . Smokeless tobacco: Not on file  . Alcohol Use: Yes     Comment: drinks weekly   OB History    Gravida Para Term Preterm AB TAB SAB Ectopic Multiple Living   0 0 0 0 0 0 0 0       Review of Systems  All other systems reviewed and are negative.     Allergies  Review of patient's allergies indicates no known allergies.  Home Medications   Prior to Admission medications   Medication Sig Start Date End Date Taking? Authorizing Provider  aspirin-acetaminophen-caffeine (EXCEDRIN MIGRAINE) 7690804524250-250-65 MG per tablet Take by mouth every 6 (six) hours as needed for headache.    Historical Provider, MD  B Complex-C (SUPER B COMPLEX PO) Take 1 tablet by mouth daily.    Historical Provider, MD  cholecalciferol  (VITAMIN D) 1000 UNITS tablet Take 4,000 Units by mouth daily.    Historical Provider, MD  gabapentin (NEURONTIN) 300 MG capsule Take 1 capsule (300 mg total) by mouth 3 (three) times daily. 11/15/13   Oletta DarterSalina Agarwal, MD  gabapentin (NEURONTIN) 300 MG capsule Take 300 mg by mouth 3 (three) times daily.    Historical Provider, MD  Melatonin 5 MG TABS Take 5 mg by mouth.    Historical Provider, MD  mirtazapine (REMERON) 30 MG tablet Take 1 tablet (30 mg total) by mouth at bedtime. 11/15/13   Oletta DarterSalina Agarwal, MD  mirtazapine (REMERON) 30 MG tablet Take 30 mg by mouth at bedtime.    Historical Provider, MD  nitrofurantoin, macrocrystal-monohydrate, (MACROBID) 100 MG capsule Take 100 mg by mouth 2 (two) times daily.    Historical Provider, MD  nitrofurantoin, macrocrystal-monohydrate, (MACROBID) 100 MG capsule Take 100 mg by mouth daily.    Historical Provider, MD  propranolol (INDERAL) 10 MG tablet Take 10mg  po qAM, 15mg  po qLunch and 15mg  po qPM for anxiety 11/15/13   Oletta DarterSalina Agarwal, MD  propranolol (INDERAL) 10 MG tablet Take 10-15 mg by mouth 3 (three) times daily. 10mg  in the am,15mg  at lunch and evening    Historical Provider, MD   BP 117/79 mmHg  Pulse 112  Temp(Src) 97.7 F (36.5 C) (Oral)  Resp 18  SpO2 99%  LMP  Physical Exam  Constitutional: She is oriented to person, place, and time. She appears well-developed and well-nourished.  Non-toxic appearance. No distress.  HENT:  Head: Normocephalic and atraumatic.  Eyes: Conjunctivae, EOM and lids are normal. Pupils are equal, round, and reactive to light.  Neck: Normal range of motion. Neck supple. No tracheal deviation present. No thyroid mass present.  Cardiovascular: Regular rhythm and normal heart sounds.  Tachycardia present.  Exam reveals no gallop.   No murmur heard. Pulmonary/Chest: Effort normal and breath sounds normal. No stridor. No respiratory distress. She has no decreased breath sounds. She has no wheezes. She has no rhonchi.  She has no rales.  Abdominal: Soft. Normal appearance and bowel sounds are normal. She exhibits no distension. There is no tenderness. There is no rebound and no CVA tenderness.  Musculoskeletal: Normal range of motion. She exhibits no edema or tenderness.  Neurological: She is alert and oriented to person, place, and time. She has normal strength. No cranial nerve deficit or sensory deficit. GCS eye subscore is 4. GCS verbal subscore is 5. GCS motor subscore is 6.  Skin: Skin is warm and dry. No abrasion and no rash noted.  Psychiatric: She has a normal mood and affect. Her speech is normal and behavior is normal. She expresses suicidal ideation. She expresses suicidal plans.  Nursing note and vitals reviewed.   ED Course  Procedures (including critical care time) Labs Review Labs Reviewed  CBC WITH DIFFERENTIAL/PLATELET  COMPREHENSIVE METABOLIC PANEL  ETHANOL  URINE RAPID DRUG SCREEN, HOSP PERFORMED  I-STAT BETA HCG BLOOD, ED ([facility], [facility], [facility] ONLY)    Imaging Review No results found. Pt to be medically cleared and will be transferred to [facility]  EKG Interpretation None      MDM   Final diagnoses:  None        Lorre Nick, MD 09/05/14 1044

## 2014-09-05 NOTE — ED Notes (Addendum)
Pt cannot provide a urine sample and [facility] reports that they will not take her w/o a UDS.

## 2014-09-05 NOTE — ED Notes (Signed)
Patients mother is going to take belongings bag with her.

## 2014-09-05 NOTE — Progress Notes (Signed)
D: In the three hours since admission, [name] has remained in her room except for meals. She denies SI/HI/AVH/pain and contracts for safety. Blunted affect. She declined the patch ("I feel fine right now.") and the IM thiamine. Pt is calm, polite, and cooperative. She can't remember whether she's had a pneumonia vaccine.  A: Meds given as ordered except as noted above. Q15 safety checks maintained. Support/encouragement offered.  R: Pt remains free from harm and proceeds with treatment. Will continue to monitor for needs/safety.

## 2014-09-05 NOTE — BH Assessment (Signed)
[facility] Assessment Progress Note  Per Berneice Heinrichina Tate, RN, [facility] Rm 306-2 once pt is medically cleared.  Pt has signed Voluntary Admission and Consent for Treatment, as well as Consent to Release Information, and signed forms [facility].  Pt's nurse has been notified, and agrees to send original paperwork along with pt via [facility], and to call report to [phone].  Doylene Canninghomas Simpson Paulos, MA Triage Specialist [phone]

## 2014-09-05 NOTE — BH Assessment (Signed)
[facility]  Dr. Jama Flavorsobos is the accepting physician.  Writer informed the Press photographerCharge Nurse Candise Bowens(Jen).  TTS Elijah Birk(Tom) will arrange for the patient to have the support paperwork signed and [facility].  The nurse will arrange transportation to [place]. The nurse can call report [facility] Ext [phone].
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
## 2014-09-05 NOTE — BH Assessment (Addendum)
[facility], Haley Fermo(Tina) patient [facility] and she will have a bed for the patient today.  Writer informed the ER MD (Dr. Freida BusmanAllen)

## 2014-09-05 NOTE — Discharge Instructions (Signed)
Go to behavior health °

## 2014-09-06 DIAGNOSIS — F1020 Alcohol dependence, uncomplicated: Principal | ICD-10-CM

## 2014-09-06 DIAGNOSIS — F4310 Post-traumatic stress disorder, unspecified: Secondary | ICD-10-CM

## 2014-09-06 DIAGNOSIS — F331 Major depressive disorder, recurrent, moderate: Secondary | ICD-10-CM

## 2014-09-06 LAB — LIPID PANEL
CHOL/HDL RATIO: 1.9 ratio
Cholesterol: 168 mg/dL (ref 0–200)
HDL: 89 mg/dL (ref >40)
LDL Cholesterol: 59 mg/dL (ref 0–99)
Triglycerides: 102 mg/dL (ref <150)
VLDL: 20 mg/dL (ref 0–40)

## 2014-09-06 MED ORDER — DULOXETINE HCL 20 MG PO CPEP
ORAL_CAPSULE | ORAL | Status: AC
  Administered 2014-09-06: 20 mg
  Filled 2014-09-06: qty 1

## 2014-09-06 MED ORDER — PROPRANOLOL HCL 10 MG PO TABS
10.0000 mg | ORAL_TABLET | Freq: Three times a day (TID) | ORAL | Status: DC
  Administered 2014-09-06 – 2014-09-08 (×6): 10 mg via ORAL
  Filled 2014-09-06 (×8): qty 1
  Filled 2014-09-06 (×2): qty 42
  Filled 2014-09-06: qty 1
  Filled 2014-09-06: qty 42
  Filled 2014-09-06: qty 1

## 2014-09-06 MED ORDER — NICOTINE 21 MG/24HR TD PT24
21.0000 mg | MEDICATED_PATCH | Freq: Every day | TRANSDERMAL | Status: DC
  Administered 2014-09-07 – 2014-09-08 (×2): 21 mg via TRANSDERMAL
  Filled 2014-09-06 (×3): qty 1

## 2014-09-06 MED ORDER — GABAPENTIN 300 MG PO CAPS
300.0000 mg | ORAL_CAPSULE | Freq: Three times a day (TID) | ORAL | Status: DC
  Administered 2014-09-06 – 2014-09-08 (×6): 300 mg via ORAL
  Filled 2014-09-06 (×5): qty 1
  Filled 2014-09-06: qty 42
  Filled 2014-09-06 (×4): qty 1
  Filled 2014-09-06 (×2): qty 42
  Filled 2014-09-06: qty 1

## 2014-09-06 MED ORDER — DULOXETINE HCL 20 MG PO CPEP
20.0000 mg | ORAL_CAPSULE | Freq: Every day | ORAL | Status: DC
  Administered 2014-09-07: 20 mg via ORAL
  Filled 2014-09-06 (×3): qty 1

## 2014-09-06 MED ORDER — QUETIAPINE FUMARATE 50 MG PO TABS
50.0000 mg | ORAL_TABLET | Freq: Every day | ORAL | Status: DC
  Administered 2014-09-06 – 2014-09-07 (×2): 50 mg via ORAL
  Filled 2014-09-06: qty 14
  Filled 2014-09-06 (×4): qty 1

## 2014-09-06 NOTE — Progress Notes (Signed)
D: Pt has depressed affect and mood.  When asked what her goal was, pt stated "I wanna do IOP after I leave here, finish up this detox from alcohol, talk to a therapist on a regular basis from this grief I'm going through, stay on my meds."  She reports withdrawal symptoms of "tingling, not as bad now, tremors not as bad now, slight headache."  Pt denies SI/HI, denies hallucinations, reports left knee pain of 8/10.  Pt has been visible in milieu interacting with peers and staff appropriately.  Pt attended evening group.   A: Introduced self to pt.  Met with pt 1:1 and provided support and encouragement.  Actively listened to pt.  Medications administered per order.  PRN medication administered for pain. R: Pt is compliant with medications.  Pt verbally contracts for safety.  Will continue to monitor and assess.

## 2014-09-06 NOTE — [facility] Suicide Risk Assessment (Signed)
[facility] INPATIENT:  Family/Significant Other Suicide Prevention Education  Suicide Prevention Education:  Education Completed; Biagio Borgancy Blackwood, Pt's mother [phone]), has been identified by the patient as the family member/significant other with whom the patient will be residing, and identified as the person(s) who will aid the patient in the event of a mental health crisis (suicidal ideations/suicide attempt).  With written consent from the patient, the family member/significant other has been provided the following suicide prevention education, prior to the and/or following the discharge of the patient.  The suicide prevention education provided includes the following:  Suicide risk factors  Suicide prevention and interventions  National Suicide Hotline telephone number  [facility] telephone number  [facility]  [facility] and/or [facility] Mobile Crisis [facility] telephone number  Request made of family/significant other to:  Remove weapons (e.g., guns, rifles, knives), all items previously/currently identified as safety concern.    Remove drugs/medications (over-the-counter, prescriptions, illicit drugs), all items previously/currently identified as a safety concern.  The family member/significant other verbalizes understanding of the suicide prevention education information provided.  The family member/significant other agrees to remove the items of safety concern listed above.  Elaina Hoopsarter, Gumecindo Hopkin M 09/06/2014, 4:42 PM
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
## 2014-09-06 NOTE — Progress Notes (Signed)
Recreation Therapy Notes  Date: 07.06.16 Time: 930 am Location: [address] Group Room  Group Topic: Stress Management  Goal Area(s) Addresses:  Patient will verbalize importance of using healthy stress management.  Patient will identify positive emotions associated with healthy stress management.   Intervention: Stress Management  Activity :  Guided Imagery.  LRT introduced and educated patients on the stress management technique of guided imagery.  Parks script was used to deliver the technique to patients.  Patients were asked to follow the script read Parks loud by LRT to engage in practicing the stress management technique.  Education:  Stress Management, Discharge Planning.   Education Outcome: Acknowledges edcuation/In group clarification offered/Needs additional education  Clinical Observations/Feedback: Did not attend group.    Haley Parks, LRT/CTRS         Haley Parks 09/06/2014 3:43 PM 

## 2014-09-06 NOTE — [facility] Group Notes (Signed)
[facility]  09/06/2014 8:45 AM  Participation Quality: Alert, Appropriate and Oriented  Mood/Affect: Appropriate  Depression Rating: 2  Anxiety Rating: 2  Thoughts of Suicide: Pt denies SI/HI  Will you contract for safety? Yes  Current AVH: Pt denies  Plan for Discharge/Comments: Pt attended discharge planning group and actively participated in group. CSW discussed suicide prevention education with the group and encouraged them to discuss discharge planning and any relevant barriers. Pt reports that she is not sure as to what type of treatment that she wants at this time. She verbalized that she plans to move soon and has to get her living arrangements settled.  Transportation Means: Pt reports access to transportation  Supports: No supports mentioned at this time  Chad CordialLauren Carter, LCSWA 09/06/2014 9:48 AM
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
## 2014-09-06 NOTE — Plan of Care (Signed)
Problem: Alteration in mood Goal: LTG-Patient reports reduction in suicidal thoughts (Patient reports reduction in suicidal thoughts and is able to verbalize a safety plan for whenever patient is feeling suicidal)  Outcome: Progressing Pt denied SI this shift and verbally contracted for safety.    Problem: Diagnosis: Increased Risk For Suicide Attempt Goal: STG-Patient Will Attend All Groups On The Unit Outcome: Progressing Pt attended evening group on 09/06/14.

## 2014-09-06 NOTE — Progress Notes (Signed)
Patient ID: Haley Parks, female   DOB: Dec 08, 1985, 29 y.o.   MRN: [id number] D: Patient has been in dayroom most of day interacting with other patients. Ciwa was  at 1200. Rates depression 6 anxiety 8 and hopelessness 3 on self inventory. Rated pain 4 in back and knee but did not request medication. Patient has depressed affect but brightens on approach.  A: Patient given emotional support from RN. Patient given medications per MD orders. Patient encouraged to attend groups and unit activities. Patient encouraged to come to staff with any questions or concerns.  R: Patient remains cooperative and appropriate. Will continue to monitor patient for safety.

## 2014-09-06 NOTE — Progress Notes (Signed)
RN 2300 Shift  D: Pt in bed resting with eyes closed. Respirations even and unlabored. Pt appears to be in no signs of distress at this time. A: Q15min checks remains for this pt. R: Pt remains safe at this time.   

## 2014-09-06 NOTE — [facility] Group Notes (Signed)
[facility] LCSW Group Therapy 09/06/2014 1:15 PM  Type of Therapy: Group Therapy- Emotion Regulation  Participation Level: Minimal  Participation Quality:  Reserved  Affect: Appropriate  Cognitive: Alert and Oriented   Insight:  Developing/Improving  Engagement in Therapy: Developing/Improving and Engaged   Modes of Intervention: Clarification, Confrontation, Discussion, Education, Exploration, Limit-setting, Orientation, Problem-solving, Rapport Building, Dance movement psychotherapisteality Testing, Socialization and Support  Summary of Progress/Problems: The topic for group today was emotional regulation. This group focused on both positive and negative emotion identification and allowed group members to process ways to identify feelings, regulate negative emotions, and find healthy ways to manage internal/external emotions. Group members were asked to reflect on a time when their reaction to an emotion led to a negative outcome and explored how alternative responses using emotion regulation would have benefited them. Group members were also asked to discuss a time when emotion regulation was utilized when a negative emotion was experienced. Pt did not participate in group discussion but engaged in side conversations with peers.    Haley CordialLauren Carter, LCSWA 09/06/2014 4:27 PM
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
## 2014-09-07 LAB — TSH: TSH: 1.134 u[IU]/mL (ref 0.350–4.500)

## 2014-09-07 LAB — HEMOGLOBIN A1C
Hgb A1c MFr Bld: 5.3 % (ref 4.8–5.6)
Mean Plasma Glucose: 105 mg/dL

## 2014-09-07 MED ORDER — DULOXETINE HCL 30 MG PO CPEP
30.0000 mg | ORAL_CAPSULE | Freq: Every day | ORAL | Status: DC
  Administered 2014-09-08: 30 mg via ORAL
  Filled 2014-09-07 (×2): qty 1
  Filled 2014-09-07: qty 14
  Filled 2014-09-07: qty 1

## 2014-09-07 MED ORDER — IBUPROFEN 600 MG PO TABS
600.0000 mg | ORAL_TABLET | Freq: Four times a day (QID) | ORAL | Status: DC | PRN
  Administered 2014-09-07: 600 mg via ORAL
  Filled 2014-09-07: qty 1

## 2014-09-07 NOTE — [facility] Group Notes (Signed)
[facility] LCSW Group Therapy 09/07/2014  1:15 pm   Type of Therapy: Group Therapy Participation Level: Active  Participation Quality: Attentive, Sharing   Affect: Flat  Cognitive: Alert and Oriented  Insight: Developing/Improving   Engagement in Therapy: Developing/Improving   Modes of Intervention: Clarification, Discussion, Education, Exploration, Limit-setting,Problem-solving, Rapport Building, Dance movement psychotherapisteality Testing, Socialization and Support  Summary of Progress/Problems: The topic for group was balance in life. Today's group focused on defining balance in one's own words, identifying things that can knock one off balance, and exploring healthy ways to maintain balance in life. Group members were asked to provide an example of a time when they felt off balance, describe how they handled that situation,and process healthier ways to regain balance in the future. Group members were asked to share the most important tool for maintaining balance that [facility] and how they plan to apply this method after discharge. [name] engaged at intervals in group discussion with tendency of intrusive sarcastic remarks. Patient frustrated with lack of work/life balance in her life. Reports stressors of legal matters make it difficult to focus on self care. Patient committed to engaging in outpatient 1:1 therapy.   Haley Bernatherine C Harrill, LCSW

## 2014-09-07 NOTE — Progress Notes (Signed)
Pt attended karaoke group. Pt was engaged and sang a song with a group of her peers.

## 2014-09-07 NOTE — Plan of Care (Signed)
Problem: Diagnosis: Increased Risk For Suicide Attempt Goal: STG-Patient Will Comply With Medication Regime Outcome: Progressing Pt has been compliant with scheduled medication this shift.    Problem: Alteration in mood & ability to function due to Goal: LTG-Pt reports reduction in suicidal thoughts (Patient reports reduction in suicidal thoughts and is able to verbalize a safety plan for whenever patient is feeling suicidal)  Outcome: Progressing Pt denied SI this shift.  She verbally contracted for safety.
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
## 2014-09-07 NOTE — Progress Notes (Signed)
D: Pt has depressed affect and depressed, anxious mood.  She describes her day as "okay."  Pt reports her goal is "to get out of here tomorrow, I wanna be able to go to work Friday night."  When asked what her aftercare plans are, pt reports she plans to go to [place] and do intensive outpatient care.  She reports she feels safe to discharge tomorrow.  Pt denies SI/HI, denies hallucinations, reports left knee pain of 8/10.  Pt was very focused on her knee pain tonight.  Pt has been visible in milieu interacting with peers and staff appropriately.  Pt attended evening group.   A:  Met with pt 1:1 and provided support and encouragement.  Medications administered per order.  PRN medication administered for pain and anxiety.  On-call provider notified that pt continued to report pain after PRN medication was administered and Ibuprofen 600 mg PO PRN was ordered and administered.  Hot and cold packs provided for pain per pt request.  Pt encouraged to rest. R: Pt is compliant with medications.  Pt verbally contracts for safety.  Will continue to monitor and assess.

## 2014-09-07 NOTE — [facility] Group Notes (Signed)
Adult Psychoeducational Group Note  Date:  09/07/2014 Time:  0900am  Group Topic/Focus:  Goals Group:   The focus of this group is to help patients establish daily goals to achieve during treatment and discuss how the patient can incorporate goal setting into their daily lives to aide in recovery. Orientation:   The focus of this group is to educate the patient on the purpose and policies of crisis stabilization and provide a format to answer questions about their admission.  The group details unit policies and expectations of patients while admitted.  Participation Level:  Did Not Attend  Lauris Poagndrea B Minda Faas 09/07/2014, 11:38 AM

## 2014-09-07 NOTE — Progress Notes (Signed)
D:  Per pt self inventory pt reports sleeping good, appetite good, energy level low, ability to pay attention good, rates depression at a 3 out of 10, hopelessness at a 2 out of 10, anxiety at an 8 out of 10, denies SI/HI/AVH, goal today: "discharge planning to get my life straight on the outside with IOP and proper therapy/psychiatry and medicine, depressed/anxious during interaction.  A:  Emotional support provided, Encouraged pt to continue with treatment plan and attend all group activities, q15 min checks maintained for safety.  R:  Pt was tired and slept most of the morning today, did not get out of bed for am medications, pt finally got out of bed right before lunch and has been up and interacting with others in the milieu, pleasant and cooperative with staff and other patients on the unit.

## 2014-09-08 MED ORDER — VITAMIN D 1000 UNITS PO TABS: 4000.0000 [IU] | ORAL_TABLET | Freq: Every day | ORAL | Status: DC

## 2014-09-08 MED ORDER — NICOTINE 21 MG/24HR TD PT24: 21.0000 mg | MEDICATED_PATCH | Freq: Every day | TRANSDERMAL | Status: DC

## 2014-09-08 MED ORDER — ADULT MULTIVITAMIN W/MINERALS CH: 1.0000 | ORAL_TABLET | Freq: Every day | ORAL | Status: DC

## 2014-09-08 MED ORDER — GABAPENTIN 300 MG PO CAPS: 300.0000 mg | ORAL_CAPSULE | Freq: Three times a day (TID) | ORAL | Status: DC

## 2014-09-08 MED ORDER — DULOXETINE HCL 30 MG PO CPEP: 30.0000 mg | ORAL_CAPSULE | Freq: Every day | ORAL | Status: DC

## 2014-09-08 MED ORDER — NICOTINE 14 MG/24HR TD PT24
14.0000 mg | MEDICATED_PATCH | Freq: Every day | TRANSDERMAL | Status: DC
  Administered 2014-09-08: 14 mg via TRANSDERMAL
  Filled 2014-09-08 (×3): qty 1

## 2014-09-08 MED ORDER — NICOTINE 14 MG/24HR TD PT24: 14.0000 mg | MEDICATED_PATCH | Freq: Every day | TRANSDERMAL | Status: DC

## 2014-09-08 MED ORDER — HYDROXYZINE HCL 25 MG PO TABS: 25.0000 mg | ORAL_TABLET | Freq: Four times a day (QID) | ORAL | Status: DC | PRN

## 2014-09-08 MED ORDER — PROPRANOLOL HCL 10 MG PO TABS: 10.0000 mg | ORAL_TABLET | Freq: Three times a day (TID) | ORAL | Status: DC

## 2014-09-08 MED ORDER — QUETIAPINE FUMARATE 50 MG PO TABS
50.0000 mg | ORAL_TABLET | Freq: Every day | ORAL | Status: DC
Start: 2014-09-08 — End: 2015-02-06

## 2014-09-08 NOTE — [facility] Group Notes (Signed)
[facility]   09/08/2014 8:50 AM  Participation Quality:  Active  Mood/Affect:  Appropriate  Depression Rating:  1  Anxiety Rating:  4  Thoughts of Suicide:  No Will you contract for safety?   NA  Current AVH:  No  Plan for Discharge/Comments:  [facility]; IOP at [facility],W&F  Transportation Means: Mother  Supports: Parents  Clide DalesHarrill, Catherine Campbell
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
## 2014-09-08 NOTE — Progress Notes (Signed)
  [facility] :  Will you be returning to the same living situation after discharge:  No.Family assisting as patient moves At discharge, do you have transportation home?: Yes,  mother Do you have the ability to pay for your medications: Yes,  through [facility]  Release of information consent forms completed and in the chart;  Patient's signature needed at discharge.  Patient to Follow up at: Follow-up Information    [facility] with [facility]   Specialty:  Behavioral Health   Why:  Please walk-in on Tuesday between 9am-12pm for your initial assessment to be set up with the Intensive Outpatient Program (IOP)   Contact information:   [address] [place] [address] [phone]       [facility]   Specialty:  Behavioral Health   Why:  You may walk-in between 8am-3pm Monday-Friday to be seen by a doctor and a therapist.   Contact information:   [address] [place] [address] [phone]       Patient denies SI/HI: Yes,  denies both    Safety Planning and Suicide Prevention discussed: Yes,  in DC planning group  Have you used any form of tobacco in the last 30 days? (Cigarettes, Smokeless Tobacco, Cigars, and/or Pipes): Yes  Has patient been referred to the Quitline?: Patient refused referral  Clide DalesHarrill, Gerlad Pelzel Campbell 09/08/2014, 10:40 AM

## 2014-09-08 NOTE — Tx Team (Signed)
Interdisciplinary Treatment Plan Update (Adult)  Date:  09/08/2014 Time Reviewed:  8:54 AM  Progress in Treatment: Attending groups: Yes. Participating in groups:  Yes. Taking medication as prescribed:  Yes. Tolerating medication:  Yes. Family/Significant othe contact made:  Yes, individual(s) contacted:  Ludwig LeanNora Blackwood, mother Patient understands diagnosis:  Yes. Discussing patient identified problems/goals with staff:  Yes. Medical problems stabilized or resolved:  Yes. Denies suicidal/homicidal ideation: Yes. Issues/concerns per patient self-inventory:  No. Other:  New problem(s) identified:   Discharge Plan or Barriers:  [facility].    Reason for Continuation of Hospitalization: Anxiety Depression Withdrawal symptoms  Estimated length of stay:  3 - 5 days  New goal(s):  Review of initial/current patient goals per problem list:  See initial plan of care   Attendees: Patient:   7/8/20168:54 AM  Family:   7/8/20168:54 AM  Physician:  Jola BaptistI Lugo MD 7/8/20168:54 AM  Nursing:   Shelda JakesPatty Duke, RN 7/8/20168:54 AM  Case Manager:  Santa GeneraAnne Nakenya Theall LCSE 7/8/20168:54 AM  Counselor:  Ronda Fairlyatherine Harrill LCSW 7/8/20168:54 AM  Other:  Onnie BoerJennifer Clark, RN UR 7/8/20168:54 AM  Other:   7/8/20168:54 AM  Other:   7/8/20168:54 AM  Other:  7/8/20168:54 AM  Other:  7/8/20168:54 AM  Other:  7/8/20168:54 AM  Other:  7/8/20168:54 AM  Other:  7/8/20168:54 AM  Other:  7/8/20168:54 AM  Other:   7/8/20168:54 AM   Scribe for Treatment Team:   Sallee Langeunningham, Gemini Bunte C, 09/08/2014, 8:54 AM
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
## 2014-09-08 NOTE — Progress Notes (Signed)
D: Patient is A&Ox4, denies SI/HI/A/V hallucinations. Patient states she is ready for discharge today. Patient will be working with [facility] after she leaves. Patient states her mom is helping her move into another family members house. Patient presents as sleepy but with an appropriate affect, brightens on approach.  A: Medications given as scheduled and prn. Emotional support given as needed. Continue Q15 minute checks for safety. R: Patient remains safe. Patient verbalizes understanding to find staff if she no longer feels like she can contract for safety or if anything changes.  [name], Wyman SongsterAngela Marie, RN

## 2014-09-08 NOTE — Progress Notes (Signed)
Patient is A&Ox4, denies SI/HI/A/V hallucinations. Patient is being discharged home with family. All belongings returned to patient, verbalized understanding of discharge instructions and follow up, no further questions at this time.  Patient stated she has a lot of plans for success once she gets home.  Lucianna Ostlund, Wyman SongsterAngela Marie, RN

## 2014-09-09 LAB — T3: T3 TOTAL: 86 ng/dL (ref 71–180)

## 2014-09-09 LAB — T4: T4, Total: 6.5 ug/dL (ref 4.5–12.0)

## 2014-12-21 DIAGNOSIS — O10919 Unspecified pre-existing hypertension complicating pregnancy, unspecified trimester: Secondary | ICD-10-CM | POA: Insufficient documentation

## 2015-01-30 ENCOUNTER — Encounter: Payer: Self-pay | Admitting: *Deleted

## 2015-02-06 ENCOUNTER — Encounter: Payer: Self-pay | Admitting: Advanced Practice Midwife

## 2015-02-06 ENCOUNTER — Ambulatory Visit (INDEPENDENT_AMBULATORY_CARE_PROVIDER_SITE_OTHER): Payer: Self-pay | Admitting: Advanced Practice Midwife

## 2015-02-06 VITALS — BP 121/73 | HR 97 | Temp 98.0°F | Ht 71.0 in | Wt 190.2 lb

## 2015-02-06 DIAGNOSIS — O0990 Supervision of high risk pregnancy, unspecified, unspecified trimester: Secondary | ICD-10-CM | POA: Insufficient documentation

## 2015-02-06 DIAGNOSIS — Z113 Encounter for screening for infections with a predominantly sexual mode of transmission: Secondary | ICD-10-CM

## 2015-02-06 DIAGNOSIS — Z9889 Other specified postprocedural states: Secondary | ICD-10-CM

## 2015-02-06 DIAGNOSIS — O3440 Maternal care for other abnormalities of cervix, unspecified trimester: Secondary | ICD-10-CM | POA: Insufficient documentation

## 2015-02-06 DIAGNOSIS — Z124 Encounter for screening for malignant neoplasm of cervix: Secondary | ICD-10-CM

## 2015-02-06 DIAGNOSIS — Z3491 Encounter for supervision of normal pregnancy, unspecified, first trimester: Secondary | ICD-10-CM

## 2015-02-06 DIAGNOSIS — O3442 Maternal care for other abnormalities of cervix, second trimester: Secondary | ICD-10-CM

## 2015-02-06 DIAGNOSIS — O30002 Twin pregnancy, unspecified number of placenta and unspecified number of amniotic sacs, second trimester: Secondary | ICD-10-CM

## 2015-02-06 DIAGNOSIS — Z23 Encounter for immunization: Secondary | ICD-10-CM

## 2015-02-06 DIAGNOSIS — Z3481 Encounter for supervision of other normal pregnancy, first trimester: Secondary | ICD-10-CM

## 2015-02-06 LAB — POCT URINALYSIS DIP (DEVICE)
BILIRUBIN URINE: NEGATIVE
GLUCOSE, UA: NEGATIVE mg/dL
Ketones, ur: NEGATIVE mg/dL
LEUKOCYTES UA: NEGATIVE
Nitrite: NEGATIVE
Protein, ur: NEGATIVE mg/dL
Specific Gravity, Urine: 1.02 (ref 1.005–1.030)
Urobilinogen, UA: 0.2 mg/dL (ref 0.0–1.0)
pH: 6 (ref 5.0–8.0)

## 2015-02-06 NOTE — Progress Notes (Signed)
Here for initial prenatal visit. States was on birth control pill when conceived. States she missed a dose or two and was on antibiotics. States is living in car with boyfriend. States noticed fingers bluish one day recently. States she would like to talk to a Child psychotherapistsocial worker but cannot stay long because she has an interview at 2pm today. Will make an apppointment to see sw this Thursday am. Heard one FHR, not sure if heard 2nd distinct FHR.

## 2015-02-06 NOTE — Patient Instructions (Signed)
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
## 2015-02-07 LAB — OBSTETRIC PANEL
Antibody Screen: NEGATIVE
BASOS ABS: 0 10*3/uL (ref 0.0–0.1)
BASOS PCT: 0 % (ref 0–1)
Eosinophils Absolute: 0 10*3/uL (ref 0.0–0.7)
Eosinophils Relative: 0 % (ref 0–5)
HEMATOCRIT: 36.9 % (ref 36.0–46.0)
HEP B S AG: NEGATIVE
Hemoglobin: 13.1 g/dL (ref 12.0–15.0)
Lymphocytes Relative: 17 % (ref 12–46)
Lymphs Abs: 1.6 10*3/uL (ref 0.7–4.0)
MCH: 33.5 pg (ref 26.0–34.0)
MCHC: 35.5 g/dL (ref 30.0–36.0)
MCV: 94.4 fL (ref 78.0–100.0)
MONO ABS: 0.6 10*3/uL (ref 0.1–1.0)
MPV: 11.9 fL (ref 8.6–12.4)
Monocytes Relative: 6 % (ref 3–12)
NEUTROS ABS: 7.2 10*3/uL (ref 1.7–7.7)
NEUTROS PCT: 77 % (ref 43–77)
Platelets: 187 10*3/uL (ref 150–400)
RBC: 3.91 MIL/uL (ref 3.87–5.11)
RDW: 12.4 % (ref 11.5–15.5)
RH TYPE: POSITIVE
Rubella: 3.27 Index — ABNORMAL HIGH (ref <0.90)
WBC: 9.3 10*3/uL (ref 4.0–10.5)

## 2015-02-07 LAB — PRESCRIPTION MONITORING PROFILE (19 PANEL)
Amphetamine/Meth: NEGATIVE ng/mL
BENZODIAZEPINE SCREEN, URINE: NEGATIVE ng/mL
Barbiturate Screen, Urine: NEGATIVE ng/mL
Buprenorphine, Urine: NEGATIVE ng/mL
CARISOPRODOL, URINE: NEGATIVE ng/mL
COCAINE METABOLITES: NEGATIVE ng/mL
CREATININE, URINE: 174.59 mg/dL (ref >20.0)
Cannabinoid Scrn, Ur: NEGATIVE ng/mL
FENTANYL URINE: NEGATIVE ng/mL
MDMA URINE: NEGATIVE ng/mL
METHADONE SCREEN, URINE: NEGATIVE ng/mL
Meperidine, Ur: NEGATIVE ng/mL
Methaqualone: NEGATIVE ng/mL
NITRITES URINE, INITIAL: NEGATIVE ug/mL
OXYCODONE SCRN UR: NEGATIVE ng/mL
Opiate Screen, Urine: NEGATIVE ng/mL
PH URINE, INITIAL: 5.7 pH (ref 4.5–8.9)
PROPOXYPHENE: NEGATIVE ng/mL
Phencyclidine, Ur: NEGATIVE ng/mL
TRAMADOL UR: NEGATIVE ng/mL
Tapentadol, urine: NEGATIVE ng/mL
ZOLPIDEM, URINE: NEGATIVE ng/mL

## 2015-02-07 LAB — WET PREP, GENITAL
TRICH WET PREP: NONE SEEN
YEAST WET PREP: NONE SEEN

## 2015-02-07 LAB — CYTOLOGY - PAP

## 2015-02-07 LAB — GC/CHLAMYDIA PROBE AMP (~~LOC~~) NOT AT ARMC
Chlamydia: NEGATIVE
Neisseria Gonorrhea: NEGATIVE

## 2015-02-08 ENCOUNTER — Encounter: Payer: Self-pay | Admitting: *Deleted

## 2015-02-08 ENCOUNTER — Ambulatory Visit: Payer: Self-pay | Admitting: *Deleted

## 2015-02-08 DIAGNOSIS — F331 Major depressive disorder, recurrent, moderate: Secondary | ICD-10-CM

## 2015-02-08 LAB — CULTURE, OB URINE
COLONY COUNT: NO GROWTH
ORGANISM ID, BACTERIA: NO GROWTH

## 2015-02-08 NOTE — Progress Notes (Signed)
Nutrition note: diet for twins consult Pt is pregnant with twins. Pt has gained 5.2# @ 3524w2d, which is wnl for twin gestation (pt reports pregravid wt is ~185#). Pt reports eating ~2-3 x/d (currently pt is between housing and has very limited access to foods). Pt is taking a PNV. Pt reports some nausea but no heartburn. NKFA. Pt received verbal & written education on pregnancy with twins. Encouraged protein with all meals & snacks. Discussed wt gain goals of 31-50# or 1.3#/wk. Pt agrees to continue taking a PNV. Pt does not have WIC but plans to apply. Pt plans to BF. F/u in 4-6 wks Blondell RevealLaura Ali Mohl, MS, RD, LDN, [facility] Limited PartnershipBCLC

## 2015-02-09 ENCOUNTER — Ambulatory Visit (HOSPITAL_COMMUNITY)
Admission: RE | Admit: 2015-02-09 | Discharge: 2015-02-09 | Disposition: A | Discharge status: Home / Self Care | Payer: Medicaid Other | Source: Ambulatory Visit | Attending: Advanced Practice Midwife | Admitting: Advanced Practice Midwife

## 2015-02-09 ENCOUNTER — Other Ambulatory Visit: Payer: Self-pay | Admitting: Advanced Practice Midwife

## 2015-02-09 ENCOUNTER — Telehealth: Payer: Self-pay | Admitting: Medical

## 2015-02-09 DIAGNOSIS — Z9889 Other specified postprocedural states: Secondary | ICD-10-CM

## 2015-02-09 DIAGNOSIS — O30001 Twin pregnancy, unspecified number of placenta and unspecified number of amniotic sacs, first trimester: Secondary | ICD-10-CM | POA: Insufficient documentation

## 2015-02-09 DIAGNOSIS — Z3491 Encounter for supervision of normal pregnancy, unspecified, first trimester: Secondary | ICD-10-CM

## 2015-02-09 DIAGNOSIS — O3442 Maternal care for other abnormalities of cervix, second trimester: Secondary | ICD-10-CM

## 2015-02-09 DIAGNOSIS — O30002 Twin pregnancy, unspecified number of placenta and unspecified number of amniotic sacs, second trimester: Secondary | ICD-10-CM

## 2015-02-09 DIAGNOSIS — Z3A13 13 weeks gestation of pregnancy: Secondary | ICD-10-CM | POA: Diagnosis not present

## 2015-02-09 DIAGNOSIS — B9689 Other specified bacterial agents as the cause of diseases classified elsewhere: Secondary | ICD-10-CM | POA: Insufficient documentation

## 2015-02-09 DIAGNOSIS — N760 Acute vaginitis: Principal | ICD-10-CM

## 2015-02-09 MED ORDER — METRONIDAZOLE 500 MG PO TABS: 500.0000 mg | ORAL_TABLET | Freq: Two times a day (BID) | ORAL | Status: DC

## 2015-02-09 NOTE — Telephone Encounter (Signed)
Patient and husband insisted on immediate results while in US today for follow-up scan as ordered by Wynelle BourgeoisMarie Williams, CNM from the [facility]. I have discussed the results with Dr. Macon LargeAnyanwu who advised that patient may follow-up as scheduled. I have personally discussed the results of the US today with the patient and her husband. All questions were answered. Bleeding/infection precautions were discussed and patient voiced understanding. Images from today's scan were given. Patient instructed to follow-up with [facility] as scheduled for routine prenatal care.   Marny LowensteinJulie N Mareo Portilla, PA-C 02/09/2015 12:34 PM

## 2015-02-12 ENCOUNTER — Encounter: Payer: Self-pay | Admitting: *Deleted

## 2015-02-21 ENCOUNTER — Encounter: Payer: Self-pay | Admitting: Family Medicine

## 2015-02-21 ENCOUNTER — Other Ambulatory Visit: Payer: Self-pay

## 2015-02-22 ENCOUNTER — Other Ambulatory Visit: Payer: Self-pay

## 2015-02-22 DIAGNOSIS — O0992 Supervision of high risk pregnancy, unspecified, second trimester: Secondary | ICD-10-CM

## 2015-02-22 DIAGNOSIS — Z3490 Encounter for supervision of normal pregnancy, unspecified, unspecified trimester: Secondary | ICD-10-CM

## 2015-02-27 LAB — AFP, QUAD SCREEN
AFP: 34.9 ng/mL
Curr Gest Age: 15.4 wks.days
Down Syndrome Scr Risk Est: <1:38500 {titer}
HCG TOTAL: 33.65 [IU]/mL
INH: 129.6 pg/mL
Interpretation-AFP: NEGATIVE
MOM FOR AFP: 1.34
MOM FOR HCG: 0.85
MoM for INH: 0.81
OPEN SPINA BIFIDA: NEGATIVE
Osb Risk: 1:4480 {titer}
Tri 18 Scr Risk Est: NEGATIVE
uE3 Mom: 0.95
uE3 Value: 0.67 ng/mL

## 2015-03-01 ENCOUNTER — Encounter: Payer: Self-pay | Admitting: Family Medicine

## 2015-03-01 ENCOUNTER — Ambulatory Visit (INDEPENDENT_AMBULATORY_CARE_PROVIDER_SITE_OTHER): Payer: Self-pay | Admitting: Family Medicine

## 2015-03-01 VITALS — BP 124/72 | HR 90 | Temp 98.5°F | Wt 196.1 lb

## 2015-03-01 DIAGNOSIS — O10912 Unspecified pre-existing hypertension complicating pregnancy, second trimester: Secondary | ICD-10-CM

## 2015-03-01 DIAGNOSIS — O0992 Supervision of high risk pregnancy, unspecified, second trimester: Secondary | ICD-10-CM

## 2015-03-01 LAB — POCT URINALYSIS DIP (DEVICE)
Bilirubin Urine: NEGATIVE
GLUCOSE, UA: NEGATIVE mg/dL
Hgb urine dipstick: NEGATIVE
KETONES UR: NEGATIVE mg/dL
Leukocytes, UA: NEGATIVE
NITRITE: NEGATIVE
PH: 7 (ref 5.0–8.0)
PROTEIN: NEGATIVE mg/dL
Specific Gravity, Urine: 1.02 (ref 1.005–1.030)
Urobilinogen, UA: 1 mg/dL (ref 0.0–1.0)

## 2015-03-01 MED ORDER — ASPIRIN EC 81 MG PO TBEC: 81.0000 mg | DELAYED_RELEASE_TABLET | Freq: Every day | ORAL | Status: DC

## 2015-03-01 NOTE — Progress Notes (Signed)
Subjective:  Haley PettiesLiana M Sitzmann is a 29 y.o. G3P1011 at 3362w4d being seen today for ongoing prenatal care.  She is currently monitored for the following issues for this high-risk pregnancy and has Major depressive disorder, recurrent episode, moderate (HCC); PTSD (post-traumatic stress disorder); Anxiety state, unspecified; Opioid dependence in remission (HCC); Amphetamine dependence, in remission (HCC); Cigarette nicotine dependence without complication; Alcohol use disorder, severe, dependence (HCC); Supervision of high risk pregnancy, antepartum; H/O cryosurgery of cervix complicating pregnancy; and Pre-existing hypertension complicating pregnancy on her problem list.  Patient reports no complaints. Continues to have some nausea.  No vomiting. Contractions: Not present. Vag. Bleeding: None.  Movement: Absent. Denies leaking of fluid.   The following portions of the patient's history were reviewed and updated as appropriate: allergies, current medications, past family history, past medical history, past social history, past surgical history and problem list. Problem list updated.  Objective:   Filed Vitals:   03/01/15 1012  BP: 124/72  Pulse: 90  Temp: 98.5 F (36.9 C)  Weight: 196 lb 1.6 oz (88.95 kg)    Fetal Status: Fetal Heart Rate (bpm): 153   Movement: Absent     General:  Alert, oriented and cooperative. Patient is in no acute distress.  Skin: Skin is warm and dry. No rash noted.   Cardiovascular: Normal heart rate noted  Respiratory: Normal respiratory effort, no problems with respiration noted  Abdomen: Soft, gravid, appropriate for gestational age. Pain/Pressure: Present     Pelvic: Vag. Bleeding: None     Cervical exam deferred        Extremities: Normal range of motion.  Edema: None  Mental Status: Normal mood and affect. Normal behavior. Normal judgment and thought content.   Urinalysis:      Assessment and Plan:  Pregnancy: G3P1011 at 5662w4d  1. Supervision of high risk  pregnancy, antepartum, second trimester Fetal survey FHT normal Vitamin B6 and unisom recommended. - US MFM OB COMP + 14 WK; Future  2. Pre-existing hypertension complicating pregnancy, second trimester Start ASA 81mg  24 hr urine - US MFM OB COMP + 14 WK; Future  Please refer to After Visit Summary for other counseling recommendations.  Return in about 4 weeks (around 03/29/2015).   Levie HeritageJacob J Annalynne Ibanez, DO

## 2015-03-01 NOTE — Progress Notes (Signed)
C/o fingertips getting blue sometimes.

## 2015-03-01 NOTE — Patient Instructions (Signed)
Vitamin B6 25mg  every 6 hours Unisom 25mg  at bedtime  Morning Sickness Morning sickness is when you feel sick to your stomach (nauseous) during pregnancy. This nauseous feeling may or may not come with vomiting. It often occurs in the morning but can be a problem any time of day. Morning sickness is most common during the first trimester, but it may continue throughout pregnancy. While morning sickness is unpleasant, it is usually harmless unless you develop severe and continual vomiting (hyperemesis gravidarum). This condition requires more intense treatment.  CAUSES  The cause of morning sickness is not completely known but seems to be related to normal hormonal changes that occur in pregnancy. RISK FACTORS You are at greater risk if you:  Experienced nausea or vomiting before your pregnancy.  Had morning sickness during a previous pregnancy.  Are pregnant with more than one baby, such as twins. TREATMENT  Do not use any medicines (prescription, over-the-counter, or herbal) for morning sickness without first talking to your health care provider. Your health care provider may prescribe or recommend:  Vitamin B6 supplements  Anti-nausea medicines.  The herbal medicine ginger. HOME CARE INSTRUCTIONS   Only take over-the-counter or prescription medicines as directed by your health care provider.  Taking multivitamins before getting pregnant can prevent or decrease the severity of morning sickness in most women.  Eat a piece of dry toast or unsalted crackers before getting out of bed in the morning.  Eat five or six small meals a day.  Eat dry and bland foods (rice, baked potato). Foods high in carbohydrates are often helpful.  Do not drink liquids with your meals. Drink liquids between meals.  Avoid greasy, fatty, and spicy foods.  Get someone to cook for you if the smell of any food causes nausea and vomiting.  If you feel nauseous after taking prenatal vitamins, take the  vitamins at night or with a snack.  Snack on protein foods (nuts, yogurt, cheese) between meals if you are hungry.  Eat unsweetened gelatins for desserts.  Wearing an acupressure wristband (worn for sea sickness) may be helpful.  Acupuncture may be helpful.  Do not smoke.  Get a humidifier to keep the air in your house free of odors.  Get plenty of fresh air. SEEK MEDICAL CARE IF:   Your home remedies are not working, and you need medicine.  You feel dizzy or lightheaded.  You are losing weight. SEEK IMMEDIATE MEDICAL CARE IF:   You have persistent and uncontrolled nausea and vomiting.  You pass out (faint). MAKE SURE YOU:  Understand these instructions.  Will watch your condition.  Will get help right away if you are not doing well or get worse.   This information is not intended to replace advice given to you by your health care provider. Make sure you discuss any questions you have with your health care provider.   Document Released: 04/10/2006 Document Revised: 02/22/2013 Document Reviewed: 08/04/2012 Elsevier Interactive Patient Education [employer]

## 2015-03-01 NOTE — Addendum Note (Signed)
Addended by: Rockwell GermanyFIELDS, Tameisha Covell A on: 03/01/2015 11:04 AM   Modules accepted: Orders

## 2015-03-04 NOTE — L&D Delivery Note (Signed)
Delivery Note At 4:01 AM a viable female was delivered via Vaginal, Spontaneous Delivery (Presentation: ; Occiput Posterior)nuchal cord x 1 loose.  APGAR:7 ,9 ; weight  .   Placenta status:spont , via shultz.  Cord: 3 vessels with the following complications:none .  Cord pH:n/a  Anesthesia: Epidural  Episiotomy: None Lacerations:  none Suture Repair: n/a Est. Blood Loss100 (mL):    Mom to postpartum.  Baby to Couplet care / Skin to Skin.  Wyvonnia DuskyMarie Terrina Docter 08/13/2015, 4:11 AM

## 2015-03-06 ENCOUNTER — Telehealth: Payer: Self-pay

## 2015-03-06 NOTE — Telephone Encounter (Signed)
Pt called and stated that she has some congestion and wants to know what she can take.

## 2015-03-07 NOTE — Telephone Encounter (Signed)
Attempted to reach patient mailbox is full at this time.

## 2015-03-12 NOTE — Telephone Encounter (Signed)
Called patient, no answer- unable to leave message due to voicemail box full 

## 2015-03-14 ENCOUNTER — Ambulatory Visit (HOSPITAL_COMMUNITY)
Admission: RE | Admit: 2015-03-14 | Discharge: 2015-03-14 | Disposition: A | Discharge status: Home / Self Care | Payer: Medicaid Other | Source: Ambulatory Visit | Attending: Family Medicine | Admitting: Family Medicine

## 2015-03-14 DIAGNOSIS — O10012 Pre-existing essential hypertension complicating pregnancy, second trimester: Secondary | ICD-10-CM | POA: Diagnosis not present

## 2015-03-14 DIAGNOSIS — O3440 Maternal care for other abnormalities of cervix, unspecified trimester: Secondary | ICD-10-CM | POA: Insufficient documentation

## 2015-03-14 DIAGNOSIS — Z3A18 18 weeks gestation of pregnancy: Secondary | ICD-10-CM | POA: Diagnosis not present

## 2015-03-14 DIAGNOSIS — O10912 Unspecified pre-existing hypertension complicating pregnancy, second trimester: Secondary | ICD-10-CM

## 2015-03-14 DIAGNOSIS — O3110X0 Continuing pregnancy after spontaneous abortion of one fetus or more, unspecified trimester, not applicable or unspecified: Secondary | ICD-10-CM | POA: Diagnosis not present

## 2015-03-14 DIAGNOSIS — O0992 Supervision of high risk pregnancy, unspecified, second trimester: Secondary | ICD-10-CM

## 2015-03-14 DIAGNOSIS — Z36 Encounter for antenatal screening of mother: Secondary | ICD-10-CM | POA: Diagnosis not present

## 2015-03-29 ENCOUNTER — Encounter: Payer: Self-pay | Admitting: Obstetrics and Gynecology

## 2015-04-09 ENCOUNTER — Encounter: Payer: Self-pay | Admitting: Family Medicine

## 2015-04-23 ENCOUNTER — Ambulatory Visit (INDEPENDENT_AMBULATORY_CARE_PROVIDER_SITE_OTHER): Payer: Self-pay | Admitting: Family Medicine

## 2015-04-23 VITALS — BP 122/78 | HR 91 | Temp 98.2°F | Wt 198.0 lb

## 2015-04-23 DIAGNOSIS — O10912 Unspecified pre-existing hypertension complicating pregnancy, second trimester: Secondary | ICD-10-CM

## 2015-04-23 DIAGNOSIS — O0992 Supervision of high risk pregnancy, unspecified, second trimester: Secondary | ICD-10-CM

## 2015-04-23 DIAGNOSIS — O3442 Maternal care for other abnormalities of cervix, second trimester: Secondary | ICD-10-CM

## 2015-04-23 DIAGNOSIS — Z9889 Other specified postprocedural states: Secondary | ICD-10-CM

## 2015-04-23 LAB — COMPREHENSIVE METABOLIC PANEL
ALK PHOS: 45 U/L (ref 33–115)
ALT: 7 U/L (ref 6–29)
AST: 12 U/L (ref 10–30)
Albumin: 3.5 g/dL — ABNORMAL LOW (ref 3.6–5.1)
BUN: 10 mg/dL (ref 7–25)
CO2: 27 mmol/L (ref 20–31)
CREATININE: 0.53 mg/dL (ref 0.50–1.10)
Calcium: 8.9 mg/dL (ref 8.6–10.2)
Chloride: 102 mmol/L (ref 98–110)
GLUCOSE: 64 mg/dL — AB (ref 65–99)
POTASSIUM: 4.4 mmol/L (ref 3.5–5.3)
SODIUM: 138 mmol/L (ref 135–146)
TOTAL PROTEIN: 6.1 g/dL (ref 6.1–8.1)
Total Bilirubin: 0.7 mg/dL (ref 0.2–1.2)

## 2015-04-23 LAB — POCT URINALYSIS DIP (DEVICE)
Bilirubin Urine: NEGATIVE
GLUCOSE, UA: NEGATIVE mg/dL
HGB URINE DIPSTICK: NEGATIVE
Ketones, ur: NEGATIVE mg/dL
LEUKOCYTES UA: NEGATIVE
NITRITE: NEGATIVE
PROTEIN: NEGATIVE mg/dL
SPECIFIC GRAVITY, URINE: 1.02 (ref 1.005–1.030)
UROBILINOGEN UA: 0.2 mg/dL (ref 0.0–1.0)
pH: 8.5 — ABNORMAL HIGH (ref 5.0–8.0)

## 2015-04-23 LAB — CBC
HCT: 36.6 % (ref 36.0–46.0)
HEMOGLOBIN: 12.4 g/dL (ref 12.0–15.0)
MCH: 32.8 pg (ref 26.0–34.0)
MCHC: 33.9 g/dL (ref 30.0–36.0)
MCV: 96.8 fL (ref 78.0–100.0)
MPV: 11.7 fL (ref 8.6–12.4)
Platelets: 170 10*3/uL (ref 150–400)
RBC: 3.78 MIL/uL — ABNORMAL LOW (ref 3.87–5.11)
RDW: 12.3 % (ref 11.5–15.5)
WBC: 10.4 10*3/uL (ref 4.0–10.5)

## 2015-04-23 NOTE — Progress Notes (Signed)
Subjective:  Haley Parks is a 30 y.o. G3P1011 at 48w1dbeing seen today for ongoing prenatal care.  She is currently monitored for the following issues for this high-risk pregnancy and has Major depressive disorder, recurrent episode, moderate ([place]; PTSD (post-traumatic stress disorder); Anxiety state, unspecified; Opioid dependence in remission ([place]; Amphetamine dependence, in remission ([place]; Cigarette nicotine dependence without complication; Alcohol use disorder, severe, dependence (HC[place]; Supervision of high risk pregnancy, antepartum; H/O cryosurgery of cervix complicating pregnancy; and Pre-existing hypertension complicating pregnancy on her problem list.  Patient reports vaginal irritation.  Contractions: Not present. Vag. Bleeding: None.  Movement: Present. Denies leaking of fluid.   The following portions of the patient's history were reviewed and updated as appropriate: allergies, current medications, past family history, past medical history, past social history, past surgical history and problem list. Problem list updated.  Objective:   Filed Vitals:   04/23/15 1041  BP: 122/78  Pulse: 91  Temp: 98.2 F (36.8 C)  Weight: 198 lb (89.812 kg)    Fetal Status: Fetal Heart Rate (bpm): 152 Fundal Height: 24 cm Movement: Present     General:  Alert, oriented and cooperative. Patient is in no acute distress.  Skin: Skin is warm and dry. No rash noted.   Cardiovascular: Normal heart rate noted  Respiratory: Normal respiratory effort, no problems with respiration noted  Abdomen: Soft, gravid, appropriate for gestational age. Pain/Pressure: Absent     Pelvic: Vag. Bleeding: None Vag D/C Character: White   Cervical exam deferred        Extremities: Normal range of motion.  Edema: None  Mental Status: Normal mood and affect. Normal behavior. Normal judgment and thought content.   Urinalysis:      Assessment and Plan:  Pregnancy: G3P1011 at 255w1d1. Pre-existing hypertension  complicating pregnancy, second trimester - well controlled today, continue ASA - Comp Met (CMET) - Protein / Creatinine Ratio, Urine - CBC  2. H/O cryosurgery of cervix complicating pregnancy, second trimester - baseline length 4cm  3. Supervision of high risk pregnancy, antepartum, second trimester - Wet prep, genital due to discharge - discussed use of probiotics (kombucha, yogurt)   Preterm labor symptoms and general obstetric precautions including but not limited to vaginal bleeding, contractions, leaking of fluid and fetal movement were reviewed in detail with the patient. Please refer to After Visit Summary for other counseling recommendations.  Return in about 4 weeks (around 05/21/2015) for Routine prenatal care.   Haley MacadamMD

## 2015-04-23 NOTE — Progress Notes (Signed)
Patient reports discharge with odor for past 1.5 weeks, feels like it is BV  Discussed breastfeeding patient

## 2015-04-23 NOTE — Patient Instructions (Signed)
Safe Medications in Pregnancy   Acne: Benzoyl Peroxide Salicylic Acid  Backache/Headache: Tylenol: 2 regular strength every 4 hours OR              2 Extra strength every 6 hours  Colds/Coughs/Allergies: Benadryl (alcohol free) 25 mg every 6 hours as needed Breath right strips Claritin Cepacol throat lozenges Chloraseptic throat spray Cold-Eeze- up to three times per day Cough drops, alcohol free Flonase (by prescription only) Guaifenesin Mucinex Robitussin DM (plain only, alcohol free) Saline nasal spray/drops Sudafed (pseudoephedrine) & Actifed ** use only after [redacted] weeks gestation and if you do not have high blood pressure Tylenol Vicks Vaporub Zinc lozenges Zyrtec   Constipation: Colace Ducolax suppositories Fleet enema Glycerin suppositories Metamucil Milk of magnesia Miralax Senokot Smooth move tea  Diarrhea: Kaopectate Imodium A-D  *NO pepto Bismol  Hemorrhoids: Anusol Anusol HC Preparation H Tucks  Indigestion: Tums Maalox Mylanta Zantac  Pepcid  Insomnia: Benadryl (alcohol free) 25mg every 6 hours as needed Tylenol PM Unisom, no Gelcaps  Leg Cramps: Tums MagGel  Nausea/Vomiting:  Bonine Dramamine Emetrol Ginger extract Sea bands Meclizine  Nausea medication to take during pregnancy:  Unisom (doxylamine succinate 25 mg tablets) Take one tablet daily at bedtime. If symptoms are not adequately controlled, the dose can be increased to a maximum recommended dose of two tablets daily (1/2 tablet in the morning, 1/2 tablet mid-afternoon and one at bedtime). Vitamin B6 100mg tablets. Take one tablet twice a day (up to 200 mg per day).  Skin Rashes: Aveeno products Benadryl cream or 25mg every 6 hours as needed Calamine Lotion 1% cortisone cream  Yeast infection: Gyne-lotrimin 7 Monistat 7   **If taking multiple medications, please check labels to avoid duplicating the same active ingredients **take medication as directed on  the label ** Do not exceed 4000 mg of tylenol in 24 hours **Do not take medications that contain aspirin or ibuprofen       Second Trimester of Pregnancy The second trimester is from week 13 through week 28, months 4 through 6. The second trimester is often a time when you feel your best. Your body has also adjusted to being pregnant, and you begin to feel better physically. Usually, morning sickness has lessened or quit completely, you may have more energy, and you may have an increase in appetite. The second trimester is also a time when the fetus is growing rapidly. At the end of the sixth month, the fetus is about 9 inches long and weighs about 1 pounds. You will likely begin to feel the baby move (quickening) between 18 and 20 weeks of the pregnancy. BODY CHANGES Your body goes through many changes during pregnancy. The changes vary from woman to woman.   Your weight will continue to increase. You will notice your lower abdomen bulging out.  You may begin to get stretch marks on your hips, abdomen, and breasts.  You may develop headaches that can be relieved by medicines approved by your health care provider.  You may urinate more often because the fetus is pressing on your bladder.  You may develop or continue to have heartburn as a result of your pregnancy.  You may develop constipation because certain hormones are causing the muscles that push waste through your intestines to slow down.  You may develop hemorrhoids or swollen, bulging veins (varicose veins).  You may have back pain because of the weight gain and pregnancy hormones relaxing your joints between the bones in your pelvis and as   a result of a shift in weight and the muscles that support your balance.  Your breasts will continue to grow and be tender.  Your gums may bleed and may be sensitive to brushing and flossing.  Dark spots or blotches (chloasma, mask of pregnancy) may develop on your face. This will likely  fade after the baby is born.  A dark line from your belly button to the pubic area (linea nigra) may appear. This will likely fade after the baby is born.  You may have changes in your hair. These can include thickening of your hair, rapid growth, and changes in texture. Some women also have hair loss during or after pregnancy, or hair that feels dry or thin. Your hair will most likely return to normal after your baby is born. WHAT TO EXPECT AT YOUR PRENATAL VISITS During a routine prenatal visit:  You will be weighed to make sure you and the fetus are growing normally.  Your blood pressure will be taken.  Your abdomen will be measured to track your baby's growth.  The fetal heartbeat will be listened to.  Any test results from the previous visit will be discussed. Your health care provider may ask you:  How you are feeling.  If you are feeling the baby move.  If you have had any abnormal symptoms, such as leaking fluid, bleeding, severe headaches, or abdominal cramping.  If you are using any tobacco products, including cigarettes, chewing tobacco, and electronic cigarettes.  If you have any questions. Other tests that may be performed during your second trimester include:  Blood tests that check for:  Low iron levels (anemia).  Gestational diabetes (between 24 and 28 weeks).  Rh antibodies.  Urine tests to check for infections, diabetes, or protein in the urine.  An ultrasound to confirm the proper growth and development of the baby.  An amniocentesis to check for possible genetic problems.  Fetal screens for spina bifida and Down syndrome.  HIV (human immunodeficiency virus) testing. Routine prenatal testing includes screening for HIV, unless you choose not to have this test. HOME CARE INSTRUCTIONS   Avoid all smoking, herbs, alcohol, and unprescribed drugs. These chemicals affect the formation and growth of the baby.  Do not use any tobacco products, including  cigarettes, chewing tobacco, and electronic cigarettes. If you need help quitting, ask your health care provider. You may receive counseling support and other resources to help you quit.  Follow your health care provider's instructions regarding medicine use. There are medicines that are either safe or unsafe to take during pregnancy.  Exercise only as directed by your health care provider. Experiencing uterine cramps is a good sign to stop exercising.  Continue to eat regular, healthy meals.  Wear a good support bra for breast tenderness.  Do not use hot tubs, steam rooms, or saunas.  Wear your seat belt at all times when driving.  Avoid raw meat, uncooked cheese, cat litter boxes, and soil used by cats. These carry germs that can cause birth defects in the baby.  Take your prenatal vitamins.  Take 1500-2000 mg of calcium daily starting at the 20th week of pregnancy until you deliver your baby.  Try taking a stool softener (if your health care provider approves) if you develop constipation. Eat more high-fiber foods, such as fresh vegetables or fruit and whole grains. Drink plenty of fluids to keep your urine clear or pale yellow.  Take warm sitz baths to soothe any pain or discomfort caused by   hemorrhoids. Use hemorrhoid cream if your health care provider approves.  If you develop varicose veins, wear support hose. Elevate your feet for 15 minutes, 3-4 times a day. Limit salt in your diet.  Avoid heavy lifting, wear low heel shoes, and practice good posture.  Rest with your legs elevated if you have leg cramps or low back pain.  Visit your dentist if you have not gone yet during your pregnancy. Use a soft toothbrush to brush your teeth and be gentle when you floss.  A sexual relationship may be continued unless your health care provider directs you otherwise.  Continue to go to all your prenatal visits as directed by your health care provider. SEEK MEDICAL CARE IF:   You have  dizziness.  You have mild pelvic cramps, pelvic pressure, or nagging pain in the abdominal area.  You have persistent nausea, vomiting, or diarrhea.  You have a bad smelling vaginal discharge.  You have pain with urination. SEEK IMMEDIATE MEDICAL CARE IF:   You have a fever.  You are leaking fluid from your vagina.  You have spotting or bleeding from your vagina.  You have severe abdominal cramping or pain.  You have rapid weight gain or loss.  You have shortness of breath with chest pain.  You notice sudden or extreme swelling of your face, hands, ankles, feet, or legs.  You have not felt your baby move in over an hour.  You have severe headaches that do not go away with medicine.  You have vision changes.   This information is not intended to replace advice given to you by your health care provider. Make sure you discuss any questions you have with your health care provider.   Document Released: 02/11/2001 Document Revised: 03/10/2014 Document Reviewed: 04/20/2012 Elsevier Interactive Patient Education 2016 [employer] Inc.  

## 2015-04-24 LAB — WET PREP, GENITAL
Trich, Wet Prep: NONE SEEN
WBC, Wet Prep HPF POC: NONE SEEN
YEAST WET PREP: NONE SEEN

## 2015-04-24 LAB — PROTEIN / CREATININE RATIO, URINE
CREATININE, URINE: 82 mg/dL (ref 20–320)
PROTEIN CREATININE RATIO: 85 mg/g{creat} (ref 21–161)
TOTAL PROTEIN, URINE: 7 mg/dL (ref 5–24)

## 2015-04-25 ENCOUNTER — Encounter (HOSPITAL_COMMUNITY): Payer: Self-pay

## 2015-04-25 ENCOUNTER — Ambulatory Visit (HOSPITAL_COMMUNITY)
Admission: RE | Admit: 2015-04-25 | Discharge: 2015-04-25 | Disposition: A | Discharge status: Home / Self Care | Payer: Medicaid Other | Source: Ambulatory Visit | Attending: Family Medicine | Admitting: Family Medicine

## 2015-04-25 ENCOUNTER — Other Ambulatory Visit (HOSPITAL_COMMUNITY): Payer: Self-pay | Admitting: Maternal and Fetal Medicine

## 2015-04-25 VITALS — BP 115/64 | HR 93 | Wt 201.2 lb

## 2015-04-25 DIAGNOSIS — O10912 Unspecified pre-existing hypertension complicating pregnancy, second trimester: Secondary | ICD-10-CM

## 2015-04-25 DIAGNOSIS — Z3A24 24 weeks gestation of pregnancy: Secondary | ICD-10-CM | POA: Diagnosis not present

## 2015-04-25 DIAGNOSIS — O0932 Supervision of pregnancy with insufficient antenatal care, second trimester: Secondary | ICD-10-CM

## 2015-04-25 DIAGNOSIS — O10919 Unspecified pre-existing hypertension complicating pregnancy, unspecified trimester: Secondary | ICD-10-CM

## 2015-04-25 DIAGNOSIS — Z0489 Encounter for examination and observation for other specified reasons: Secondary | ICD-10-CM

## 2015-04-25 DIAGNOSIS — O10019 Pre-existing essential hypertension complicating pregnancy, unspecified trimester: Secondary | ICD-10-CM | POA: Diagnosis present

## 2015-04-25 DIAGNOSIS — IMO0002 Reserved for concepts with insufficient information to code with codable children: Secondary | ICD-10-CM

## 2015-05-03 ENCOUNTER — Encounter: Payer: Self-pay | Admitting: Family Medicine

## 2015-05-03 DIAGNOSIS — O2343 Unspecified infection of urinary tract in pregnancy, third trimester: Secondary | ICD-10-CM

## 2015-05-10 ENCOUNTER — Inpatient Hospital Stay (HOSPITAL_COMMUNITY): Payer: Medicaid Other

## 2015-05-10 ENCOUNTER — Encounter (HOSPITAL_COMMUNITY): Payer: Self-pay | Admitting: *Deleted

## 2015-05-10 ENCOUNTER — Inpatient Hospital Stay (HOSPITAL_COMMUNITY)
Admission: AD | Admit: 2015-05-10 | Discharge: 2015-05-10 | Disposition: A | Discharge status: Home / Self Care | Payer: Medicaid Other | Source: Ambulatory Visit | Attending: Emergency Medicine | Admitting: Emergency Medicine

## 2015-05-10 DIAGNOSIS — F329 Major depressive disorder, single episode, unspecified: Secondary | ICD-10-CM | POA: Diagnosis not present

## 2015-05-10 DIAGNOSIS — O0992 Supervision of high risk pregnancy, unspecified, second trimester: Secondary | ICD-10-CM

## 2015-05-10 DIAGNOSIS — F419 Anxiety disorder, unspecified: Secondary | ICD-10-CM | POA: Diagnosis not present

## 2015-05-10 DIAGNOSIS — S0990XA Unspecified injury of head, initial encounter: Secondary | ICD-10-CM | POA: Diagnosis not present

## 2015-05-10 DIAGNOSIS — F17210 Nicotine dependence, cigarettes, uncomplicated: Secondary | ICD-10-CM | POA: Insufficient documentation

## 2015-05-10 DIAGNOSIS — S0993XA Unspecified injury of face, initial encounter: Secondary | ICD-10-CM | POA: Insufficient documentation

## 2015-05-10 DIAGNOSIS — M542 Cervicalgia: Secondary | ICD-10-CM

## 2015-05-10 DIAGNOSIS — S3992XA Unspecified injury of lower back, initial encounter: Secondary | ICD-10-CM | POA: Diagnosis not present

## 2015-05-10 DIAGNOSIS — H1132 Conjunctival hemorrhage, left eye: Secondary | ICD-10-CM | POA: Insufficient documentation

## 2015-05-10 DIAGNOSIS — Z9889 Other specified postprocedural states: Secondary | ICD-10-CM

## 2015-05-10 DIAGNOSIS — R42 Dizziness and giddiness: Secondary | ICD-10-CM | POA: Diagnosis not present

## 2015-05-10 DIAGNOSIS — Y9389 Activity, other specified: Secondary | ICD-10-CM | POA: Diagnosis not present

## 2015-05-10 DIAGNOSIS — Y998 Other external cause status: Secondary | ICD-10-CM | POA: Diagnosis not present

## 2015-05-10 DIAGNOSIS — F1310 Sedative, hypnotic or anxiolytic abuse, uncomplicated: Secondary | ICD-10-CM | POA: Diagnosis not present

## 2015-05-10 DIAGNOSIS — O3442 Maternal care for other abnormalities of cervix, second trimester: Secondary | ICD-10-CM

## 2015-05-10 DIAGNOSIS — Z87440 Personal history of urinary (tract) infections: Secondary | ICD-10-CM | POA: Insufficient documentation

## 2015-05-10 DIAGNOSIS — S0083XA Contusion of other part of head, initial encounter: Secondary | ICD-10-CM | POA: Diagnosis not present

## 2015-05-10 DIAGNOSIS — Y9289 Other specified places as the place of occurrence of the external cause: Secondary | ICD-10-CM | POA: Insufficient documentation

## 2015-05-10 DIAGNOSIS — S199XXA Unspecified injury of neck, initial encounter: Secondary | ICD-10-CM | POA: Insufficient documentation

## 2015-05-10 DIAGNOSIS — Z79899 Other long term (current) drug therapy: Secondary | ICD-10-CM | POA: Insufficient documentation

## 2015-05-10 DIAGNOSIS — J029 Acute pharyngitis, unspecified: Secondary | ICD-10-CM | POA: Diagnosis not present

## 2015-05-10 LAB — RAPID URINE DRUG SCREEN, HOSP PERFORMED
Amphetamines: NOT DETECTED
Barbiturates: NOT DETECTED
Benzodiazepines: POSITIVE — AB
Cocaine: NOT DETECTED
Opiates: NOT DETECTED
Tetrahydrocannabinol: NOT DETECTED

## 2015-05-10 MED ORDER — ACETAMINOPHEN 325 MG PO TABS
650.0000 mg | ORAL_TABLET | Freq: Once | ORAL | Status: AC
  Administered 2015-05-10: 650 mg via ORAL
  Filled 2015-05-10: qty 2
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
## 2015-05-10 NOTE — MAU Note (Signed)
Urine in lab 
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
## 2015-05-10 NOTE — ED Notes (Signed)
PA at bedside.
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
## 2015-05-17 ENCOUNTER — Ambulatory Visit (INDEPENDENT_AMBULATORY_CARE_PROVIDER_SITE_OTHER): Payer: Self-pay | Admitting: Family

## 2015-05-17 VITALS — BP 118/74 | HR 74 | Temp 98.2°F | Wt 197.8 lb

## 2015-05-17 DIAGNOSIS — O0993 Supervision of high risk pregnancy, unspecified, third trimester: Secondary | ICD-10-CM

## 2015-05-17 DIAGNOSIS — G4700 Insomnia, unspecified: Secondary | ICD-10-CM

## 2015-05-17 DIAGNOSIS — O10913 Unspecified pre-existing hypertension complicating pregnancy, third trimester: Secondary | ICD-10-CM

## 2015-05-17 LAB — CBC
HEMATOCRIT: 34.6 % — AB (ref 36.0–46.0)
HEMOGLOBIN: 11.8 g/dL — AB (ref 12.0–15.0)
MCH: 32.2 pg (ref 26.0–34.0)
MCHC: 34.1 g/dL (ref 30.0–36.0)
MCV: 94.5 fL (ref 78.0–100.0)
MPV: 11.6 fL (ref 8.6–12.4)
Platelets: 173 10*3/uL (ref 150–400)
RBC: 3.66 MIL/uL — AB (ref 3.87–5.11)
RDW: 12.1 % (ref 11.5–15.5)
WBC: 10.4 10*3/uL (ref 4.0–10.5)

## 2015-05-17 LAB — POCT URINALYSIS DIP (DEVICE)
BILIRUBIN URINE: NEGATIVE
Glucose, UA: NEGATIVE mg/dL
KETONES UR: NEGATIVE mg/dL
NITRITE: POSITIVE — AB
PH: 7.5 (ref 5.0–8.0)
Protein, ur: NEGATIVE mg/dL
SPECIFIC GRAVITY, URINE: 1.02 (ref 1.005–1.030)
Urobilinogen, UA: 0.2 mg/dL (ref 0.0–1.0)

## 2015-05-17 MED ORDER — GABAPENTIN 300 MG PO CAPS: 300.0000 mg | ORAL_CAPSULE | Freq: Every day | ORAL | Status: DC

## 2015-05-17 MED ORDER — TETANUS-DIPHTH-ACELL PERTUSSIS 5-2.5-18.5 LF-MCG/0.5 IM SUSP
0.5000 mL | Freq: Once | INTRAMUSCULAR | Status: AC
  Administered 2015-05-17: 0.5 mL via INTRAMUSCULAR

## 2015-05-17 NOTE — Addendum Note (Signed)
Addended by: Cheree DittoGRAHAM, Yeraldin Litzenberger A on: 05/17/2015 03:21 PM   Modules accepted: Orders

## 2015-05-17 NOTE — Patient Instructions (Signed)
AREA PEDIATRIC/FAMILY PRACTICE PHYSICIANS  [facility] [address] [place], [place] [address] Phone - [phone]   Fax - [phone]  [facility] [address] [place], [place]  [address] Phone - [phone]   Fax - [phone]  [facility] [address] [place], [place]  [address] Phone - [phone]   Fax - [phone]   [facility] [address] [place], [place]  [address] Phone - [phone]   Fax - [phone]  [facility] [address] [place], [place]  [address] Phone - [phone]   Fax - [phone]  [facility] [address] [place], [place]  [address] Phone - [phone]   Fax - [phone]  [facility] [address] [place], [place]  [address] Phone - [phone]   Fax - [phone]  [facility] [address], [address] [place], [place]  [address] Phone - [phone]   Fax - [phone]  [facility] [address] [place], [place]  [address] Phone - [phone]   Fax - [phone] [facility] [address] [place], [place]  [address] Phone - [phone]   Fax - [phone]  [facility] [address] [place], [place]  [address] Phone - [phone]   Fax - [phone]  [facility] [address] [place], [place]  [address] Phone - [phone]   Fax - [phone]  [facility] [address] [place], [place]  [address] Phone - [phone]   Fax - [phone]  [facility]IL[facility] GOSRANI [address] [place], [place]  [address] Phone - [phone]  [facility] [address] [place], [place]  [address] Phone - [phone]   Fax - [phone]  [facility] [address] [place], [place]  [address] Phone - [phone]   Fax - [phone]  [facility] [address] [place], [place]  [address] Phone - [phone]   Fax - [phone]  [facility] [address] [place], [place]  [address] Phone - [phone]   Fax - [phone]   [facility] [address] [place], [place]  [address] Phone - [phone]   Fax - [phone]  [facility] [address] [place], [place]  [address] Phone - [phone]   Fax - [phone]  DAV[facility] RUBIN [address] [place], [place]  [address] Phone - [phone]   Fax - [phone]  [facility] [address] [place], [place]  [address] Phone - [phone]   Fax - [phone]  [facility] [address] , [place]  [address] Phone - [phone]   Fax - [phone] [facility] [address] [place], [place]  [address] Phone - [phone]   Fax - [phone]  [facility] [address] [place], [place]  [address] Phone - [phone]   Fax - [phone]  [facility] [address] [place], [place]  [address] Phone - [phone]   Fax - [phone]   

## 2015-05-17 NOTE — Progress Notes (Signed)
Subjective:  Jamison OkaLiana M Xxxaman is a 30 y.o. G3P1011 at [redacted]w[redacted]d being seen today for ongoing prenatal care.  She is currently monitored for the following issues for this high-risk pregnancy and has Major depressive disorder, recurrent episode, moderate (HCC); PTSD (post-traumatic stress disorder); Anxiety state, unspecified; Opioid dependence in remission (HCC); Amphetamine dependence, in remission (HCC); Cigarette nicotine dependence without complication; Alcohol use disorder, severe, dependence (HCC); Supervision of high risk pregnancy, antepartum; H/O cryosurgery of cervix complicating pregnancy; and Pre-existing hypertension complicating pregnancy on her problem list.  Patient reports difficulty sleeping and was told by prior provider she could take neurontin at bedtime.  Does not have any additional meds.  Contractions: Not present.  .  Movement: Present. Denies leaking of fluid.   The following portions of the patient's history were reviewed and updated as appropriate: allergies, current medications, past family history, past medical history, past social history, past surgical history and problem list. Problem list updated.  Objective:   Filed Vitals:   05/17/15 1144  BP: 118/74  Pulse: 74  Temp: 98.2 F (36.8 C)  Weight: 197 lb 12.8 oz (89.721 kg)    Fetal Status: Fetal Heart Rate (bpm): 146 Fundal Height: 28 cm Movement: Present     General:  Alert, oriented and cooperative. Patient is in no acute distress.  Skin: Skin is warm and dry. No rash noted.   Cardiovascular: Normal heart rate noted  Respiratory: Normal respiratory effort, no problems with respiration noted  Abdomen: Soft, gravid, appropriate for gestational age. Pain/Pressure: Absent     Pelvic:       Cervical exam deferred        Extremities: Normal range of motion.     Mental Status: Normal mood and affect. Normal behavior. Normal judgment and thought content.   Urinalysis:     Urine results not available at discharge.      Assessment and Plan:  Pregnancy: G3P1011 at 384w4d  1. Supervision of high risk pregnancy, antepartum, third trimester - Glucose Tolerance, 1 HR (50g) w/o Fasting - RPR - HIV antibody (with reflex) - CBC  2. Pre-existing hypertension complicating pregnancy, third trimester - Continue monitoring  3.  Insomnia - Discussed with Karyl KinnierKim Newton regarding dose and pt was told only for night time use - RX Neurontin 300 mg HS for sleep  Preterm labor symptoms and general obstetric precautions including but not limited to vaginal bleeding, contractions, leaking of fluid and fetal movement were reviewed in detail with the patient. Please refer to After Visit Summary for other counseling recommendations.  Return in about 2 weeks (around 05/31/2015).   Eino FarberWalidah Kennith GainN Karim, CNM

## 2015-05-17 NOTE — Addendum Note (Signed)
Addended by: Cheree DittoGRAHAM, DEMETRICE A on: 05/17/2015 05:00 PM   Modules accepted: Orders

## 2015-05-18 LAB — HIV ANTIBODY (ROUTINE TESTING W REFLEX): HIV 1&2 Ab, 4th Generation: NONREACTIVE

## 2015-05-18 LAB — GLUCOSE TOLERANCE, 1 HOUR (50G) W/O FASTING: GLUCOSE, 1 HR, GESTATIONAL: 91 mg/dL (ref <140)

## 2015-05-19 LAB — RPR

## 2015-05-19 LAB — CULTURE, OB URINE: Colony Count: >=100000

## 2015-05-22 ENCOUNTER — Other Ambulatory Visit: Payer: Self-pay | Admitting: Family

## 2015-05-22 DIAGNOSIS — O2340 Unspecified infection of urinary tract in pregnancy, unspecified trimester: Secondary | ICD-10-CM | POA: Insufficient documentation

## 2015-05-22 MED ORDER — NITROFURANTOIN MONOHYD MACRO 100 MG PO CAPS: 100.0000 mg | ORAL_CAPSULE | Freq: Two times a day (BID) | ORAL | Status: DC

## 2015-05-22 MED FILL — NITROFURANTOIN MONO-MCR 100: 100 | 7 days supply | Qty: 14 | Fill #0

## 2015-05-22 NOTE — Telephone Encounter (Signed)
Left voicemail message and sent message via MyChart regarding UTI and RX sent to pharmacy.

## 2015-05-31 ENCOUNTER — Ambulatory Visit: Payer: Medicaid Other | Admitting: Obstetrics & Gynecology

## 2015-05-31 DIAGNOSIS — O0993 Supervision of high risk pregnancy, unspecified, third trimester: Secondary | ICD-10-CM

## 2015-05-31 NOTE — Progress Notes (Signed)
Patient arrived but did not check in and left without being seen today.

## 2015-06-06 ENCOUNTER — Encounter (HOSPITAL_COMMUNITY): Payer: Self-pay

## 2015-06-06 ENCOUNTER — Ambulatory Visit (HOSPITAL_COMMUNITY)
Admission: RE | Admit: 2015-06-06 | Discharge: 2015-06-06 | Disposition: A | Discharge status: Home / Self Care | Payer: Medicaid Other | Source: Ambulatory Visit | Attending: Family Medicine | Admitting: Family Medicine

## 2015-06-06 DIAGNOSIS — O3123X0 Continuing pregnancy after intrauterine death of one fetus or more, third trimester, not applicable or unspecified: Secondary | ICD-10-CM | POA: Insufficient documentation

## 2015-06-06 DIAGNOSIS — O0933 Supervision of pregnancy with insufficient antenatal care, third trimester: Secondary | ICD-10-CM | POA: Insufficient documentation

## 2015-06-06 DIAGNOSIS — Z3A30 30 weeks gestation of pregnancy: Secondary | ICD-10-CM | POA: Diagnosis not present

## 2015-06-06 DIAGNOSIS — F1910 Other psychoactive substance abuse, uncomplicated: Secondary | ICD-10-CM | POA: Diagnosis not present

## 2015-06-06 DIAGNOSIS — O99323 Drug use complicating pregnancy, third trimester: Secondary | ICD-10-CM | POA: Insufficient documentation

## 2015-06-06 DIAGNOSIS — O10013 Pre-existing essential hypertension complicating pregnancy, third trimester: Secondary | ICD-10-CM | POA: Diagnosis not present

## 2015-06-06 DIAGNOSIS — O30003 Twin pregnancy, unspecified number of placenta and unspecified number of amniotic sacs, third trimester: Secondary | ICD-10-CM | POA: Insufficient documentation

## 2015-06-06 DIAGNOSIS — O10919 Unspecified pre-existing hypertension complicating pregnancy, unspecified trimester: Secondary | ICD-10-CM

## 2015-06-09 ENCOUNTER — Encounter: Payer: Self-pay | Admitting: *Deleted

## 2015-06-11 ENCOUNTER — Encounter: Payer: Self-pay | Admitting: Family Medicine

## 2015-06-11 ENCOUNTER — Ambulatory Visit (INDEPENDENT_AMBULATORY_CARE_PROVIDER_SITE_OTHER): Payer: Medicaid Other | Admitting: Family Medicine

## 2015-06-11 VITALS — BP 107/68 | HR 82 | Temp 97.9°F | Wt 205.9 lb

## 2015-06-11 DIAGNOSIS — O10913 Unspecified pre-existing hypertension complicating pregnancy, third trimester: Secondary | ICD-10-CM

## 2015-06-11 DIAGNOSIS — F1190 Opioid use, unspecified, uncomplicated: Secondary | ICD-10-CM | POA: Diagnosis not present

## 2015-06-11 DIAGNOSIS — F329 Major depressive disorder, single episode, unspecified: Secondary | ICD-10-CM

## 2015-06-11 DIAGNOSIS — J4520 Mild intermittent asthma, uncomplicated: Secondary | ICD-10-CM | POA: Diagnosis not present

## 2015-06-11 DIAGNOSIS — O99323 Drug use complicating pregnancy, third trimester: Secondary | ICD-10-CM | POA: Diagnosis not present

## 2015-06-11 DIAGNOSIS — O99343 Other mental disorders complicating pregnancy, third trimester: Secondary | ICD-10-CM

## 2015-06-11 DIAGNOSIS — O99333 Smoking (tobacco) complicating pregnancy, third trimester: Secondary | ICD-10-CM | POA: Diagnosis not present

## 2015-06-11 DIAGNOSIS — N898 Other specified noninflammatory disorders of vagina: Secondary | ICD-10-CM

## 2015-06-11 DIAGNOSIS — F419 Anxiety disorder, unspecified: Secondary | ICD-10-CM

## 2015-06-11 DIAGNOSIS — O0993 Supervision of high risk pregnancy, unspecified, third trimester: Secondary | ICD-10-CM

## 2015-06-11 DIAGNOSIS — O9989 Other specified diseases and conditions complicating pregnancy, childbirth and the puerperium: Secondary | ICD-10-CM | POA: Diagnosis not present

## 2015-06-11 DIAGNOSIS — F17210 Nicotine dependence, cigarettes, uncomplicated: Secondary | ICD-10-CM | POA: Diagnosis not present

## 2015-06-11 LAB — POCT URINALYSIS DIP (DEVICE)
BILIRUBIN URINE: NEGATIVE
GLUCOSE, UA: NEGATIVE mg/dL
Hgb urine dipstick: NEGATIVE
Ketones, ur: NEGATIVE mg/dL
Leukocytes, UA: NEGATIVE
Nitrite: NEGATIVE
Protein, ur: NEGATIVE mg/dL
SPECIFIC GRAVITY, URINE: 1.02 (ref 1.005–1.030)
UROBILINOGEN UA: 0.2 mg/dL (ref 0.0–1.0)
pH: 7 (ref 5.0–8.0)

## 2015-06-11 MED ORDER — ALBUTEROL SULFATE HFA 108 (90 BASE) MCG/ACT IN AERS: 2.0000 | INHALATION_SPRAY | Freq: Four times a day (QID) | RESPIRATORY_TRACT | Status: AC | PRN

## 2015-06-11 NOTE — Progress Notes (Signed)
Subjective:  Haley Parks is a 30 y.o. G3P1011 at 2823w1d being seen today for ongoing prenatal care.  She is currently monitored for the following issues for this high-risk pregnancy and has Major depressive disorder, recurrent episode, moderate (HCC); PTSD (post-traumatic stress disorder); Anxiety state, unspecified; Opioid dependence in remission (HCC); Amphetamine dependence, in remission (HCC); Cigarette nicotine dependence without complication; Alcohol use disorder, severe, dependence (HCC); Supervision of high risk pregnancy, antepartum; H/O cryosurgery of cervix complicating pregnancy; Pre-existing hypertension complicating pregnancy; and UTI in pregnancy, antepartum on her problem list.  Patient reports staying wet, like leaking. No signs of infection.  Contractions: Not present. Vag. Bleeding: None.  Movement: Present. Denies leaking of fluid.   The following portions of the patient's history were reviewed and updated as appropriate: allergies, current medications, past family history, past medical history, past social history, past surgical history and problem list. Problem list updated.  Objective:   Filed Vitals:   06/11/15 0955  BP: 107/68  Pulse: 82  Temp: 97.9 F (36.6 C)  Weight: 205 lb 14.4 oz (93.396 kg)    Fetal Status: Fetal Heart Rate (bpm): 135 Fundal Height: 31 cm Movement: Present     General:  Alert, oriented and cooperative. Patient is in no acute distress.  Skin: Skin is warm and dry. No rash noted.   Cardiovascular: Normal heart rate noted  Respiratory: Normal respiratory effort, no problems with respiration noted  Abdomen: Soft, gravid, appropriate for gestational age. Pain/Pressure: Absent     Pelvic: Vag. Bleeding: None Vag D/C Character: Watery   SSE reveals, white discharge, no pool, neg fern        Extremities: Normal range of motion.  Edema: None  Mental Status: Normal mood and affect. Normal behavior. Normal judgment and thought content.    Urinalysis: Urine Protein: Negative Urine Glucose: Negative U/S 4/5 vtx, AFI 20, 4 lb 76% Assessment and Plan:  Pregnancy: G3P1011 at 3923w1d  1. Supervision of high risk pregnancy, antepartum, third trimester Continue prenatal care.  2. Pre-existing hypertension complicating pregnancy, third trimester Normal growth Begin 2x/wk testing next week  3. Asthma, mild intermittent, uncomplicated Mostly exacerbated due to allergies List of meds ok for pregnancy given. - albuterol (PROVENTIL HFA;VENTOLIN HFA) 108 (90 Base) MCG/ACT inhaler; Inhale 2 puffs into the lungs every 6 (six) hours as needed for wheezing or shortness of breath.  Dispense: 1 Inhaler; Refill: 2  4. Vaginal discharge - Wet prep, genital  Preterm labor symptoms and general obstetric precautions including but not limited to vaginal bleeding, contractions, leaking of fluid and fetal movement were reviewed in detail with the patient. Please refer to After Visit Summary for other counseling recommendations.  Return in 1 week (on 06/18/2015) [facility] and [facility].   Reva Boresanya S Julianny Milstein, MD

## 2015-06-11 NOTE — Progress Notes (Signed)
Breastfeeding tip of the week reviewed Pt c/o thin watery discharge, off and on x1week

## 2015-06-11 NOTE — Patient Instructions (Addendum)
You can take Mucinex, (Allegra, Claritin, Zyrtec-any one of these--they are the same class--same as Benadryl), Sudafed, saline nasal spray, Nasonex, Flonase, Do not take Phenylephrine, Afrin type nose sprays.  Breastfeeding Deciding to breastfeed is one of the best choices you can make for you and your baby. A change in hormones during pregnancy causes your breast tissue to grow and increases the number and size of your milk ducts. These hormones also allow proteins, sugars, and fats from your blood supply to make breast milk in your milk-producing glands. Hormones prevent breast milk from being released before your baby is born as well as prompt milk flow after birth. Once breastfeeding has begun, thoughts of your baby, as well as his or her sucking or crying, can stimulate the release of milk from your milk-producing glands.  BENEFITS OF BREASTFEEDING For Your Baby  Your first milk (colostrum) helps your baby's digestive system function better.  There are antibodies in your milk that help your baby fight off infections.  Your baby has a lower incidence of asthma, allergies, and sudden infant death syndrome.  The nutrients in breast milk are better for your baby than infant formulas and are designed uniquely for your baby's needs.  Breast milk improves your baby's brain development.  Your baby is less likely to develop other conditions, such as childhood obesity, asthma, or type 2 diabetes mellitus. For You  Breastfeeding helps to create a very special bond between you and your baby.  Breastfeeding is convenient. Breast milk is always available at the correct temperature and costs nothing.  Breastfeeding helps to burn calories and helps you lose the weight gained during pregnancy.  Breastfeeding makes your uterus contract to its prepregnancy size faster and slows bleeding (lochia) after you give birth.   Breastfeeding helps to lower your risk of developing type 2 diabetes mellitus,  osteoporosis, and breast or ovarian cancer later in life. SIGNS THAT YOUR BABY IS HUNGRY Early Signs of Hunger  Increased alertness or activity.  Stretching.  Movement of the head from side to side.  Movement of the head and opening of the mouth when the corner of the mouth or cheek is stroked (rooting).  Increased sucking sounds, smacking lips, cooing, sighing, or squeaking.  Hand-to-mouth movements.  Increased sucking of fingers or hands. Late Signs of Hunger  Fussing.  Intermittent crying. Extreme Signs of Hunger Signs of extreme hunger will require calming and consoling before your baby will be able to breastfeed successfully. Do not wait for the following signs of extreme hunger to occur before you initiate breastfeeding:  Restlessness.  A loud, strong cry.  Screaming. BREASTFEEDING BASICS Breastfeeding Initiation  Find a comfortable place to sit or lie down, with your neck and back well supported.  Place a pillow or rolled up blanket under your baby to bring him or her to the level of your breast (if you are seated). Nursing pillows are specially designed to help support your arms and your baby while you breastfeed.  Make sure that your baby's abdomen is facing your abdomen.  Gently massage your breast. With your fingertips, massage from your chest wall toward your nipple in a circular motion. This encourages milk flow. You may need to continue this action during the feeding if your milk flows slowly.  Support your breast with 4 fingers underneath and your thumb above your nipple. Make sure your fingers are well away from your nipple and your baby's mouth.  Stroke your baby's lips gently with your finger or  nipple.  When your baby's mouth is open wide enough, quickly bring your baby to your breast, placing your entire nipple and as much of the colored area around your nipple (areola) as possible into your baby's mouth.  More areola should be visible above your  baby's upper lip than below the lower lip.  Your baby's tongue should be between his or her lower gum and your breast.  Ensure that your baby's mouth is correctly positioned around your nipple (latched). Your baby's lips should create a seal on your breast and be turned out (everted).  It is common for your baby to suck about 2-3 minutes in order to start the flow of breast milk. Latching Teaching your baby how to latch on to your breast properly is very important. An improper latch can cause nipple pain and decreased milk supply for you and poor weight gain in your baby. Also, if your baby is not latched onto your nipple properly, he or she may swallow some air during feeding. This can make your baby fussy. Burping your baby when you switch breasts during the feeding can help to get rid of the air. However, teaching your baby to latch on properly is still the best way to prevent fussiness from swallowing air while breastfeeding. Signs that your baby has successfully latched on to your nipple:  Silent tugging or silent sucking, without causing you pain.  Swallowing heard between every 3-4 sucks.  Muscle movement above and in front of his or her ears while sucking. Signs that your baby has not successfully latched on to nipple:  Sucking sounds or smacking sounds from your baby while breastfeeding.  Nipple pain. If you think your baby has not latched on correctly, slip your finger into the corner of your baby's mouth to break the suction and place it between your baby's gums. Attempt breastfeeding initiation again. Signs of Successful Breastfeeding Signs from your baby:  A gradual decrease in the number of sucks or complete cessation of sucking.  Falling asleep.  Relaxation of his or her body.  Retention of a small amount of milk in his or her mouth.  Letting go of your breast by himself or herself. Signs from you:  Breasts that have increased in firmness, weight, and size 1-3 hours  after feeding.  Breasts that are softer immediately after breastfeeding.  Increased milk volume, as well as a change in milk consistency and color by the fifth day of breastfeeding.  Nipples that are not sore, cracked, or bleeding. Signs That Your Pecola LeisureBaby is Getting Enough Milk  Wetting at least 3 diapers in a 24-hour period. The urine should be clear and pale yellow by age 364 days.  At least 3 stools in a 24-hour period by age 364 days. The stool should be soft and yellow.  At least 3 stools in a 24-hour period by age 28 days. The stool should be seedy and yellow.  No loss of weight greater than 10% of birth weight during the first 423 days of age.  Average weight gain of 4-7 ounces (113-198 g) per week after age 364 days.  Consistent daily weight gain by age 364 days, without weight loss after the age of 2 weeks. After a feeding, your baby may spit up a small amount. This is common. BREASTFEEDING FREQUENCY AND DURATION Frequent feeding will help you make more milk and can prevent sore nipples and breast engorgement. Breastfeed when you feel the need to reduce the fullness of your breasts or  when your baby shows signs of hunger. This is called "breastfeeding on demand." Avoid introducing a pacifier to your baby while you are working to establish breastfeeding (the first 4-6 weeks after your baby is born). After this time you may choose to use a pacifier. Research has shown that pacifier use during the first year of a baby's life decreases the risk of sudden infant death syndrome (SIDS). Allow your baby to feed on each breast as long as he or she wants. Breastfeed until your baby is finished feeding. When your baby unlatches or falls asleep while feeding from the first breast, offer the second breast. Because newborns are often sleepy in the first few weeks of life, you may need to awaken your baby to get him or her to feed. Breastfeeding times will vary from baby to baby. However, the following rules can  serve as a guide to help you ensure that your baby is properly fed:  Newborns (babies 65 weeks of age or younger) may breastfeed every 1-3 hours.  Newborns should not go longer than 3 hours during the day or 5 hours during the night without breastfeeding.  You should breastfeed your baby a minimum of 8 times in a 24-hour period until you begin to introduce solid foods to your baby at around 65 months of age. BREAST MILK PUMPING Pumping and storing breast milk allows you to ensure that your baby is exclusively fed your breast milk, even at times when you are unable to breastfeed. This is especially important if you are going back to work while you are still breastfeeding or when you are not able to be present during feedings. Your lactation consultant can give you guidelines on how long it is safe to store breast milk. A breast pump is a machine that allows you to pump milk from your breast into a sterile bottle. The pumped breast milk can then be stored in a refrigerator or freezer. Some breast pumps are operated by hand, while others use electricity. Ask your lactation consultant which type will work best for you. Breast pumps can be purchased, but some hospitals and breastfeeding support groups lease breast pumps on a monthly basis. A lactation consultant can teach you how to hand express breast milk, if you prefer not to use a pump. CARING FOR YOUR BREASTS WHILE YOU BREASTFEED Nipples can become dry, cracked, and sore while breastfeeding. The following recommendations can help keep your breasts moisturized and healthy:  Avoid using soap on your nipples.  Wear a supportive bra. Although not required, special nursing bras and tank tops are designed to allow access to your breasts for breastfeeding without taking off your entire bra or top. Avoid wearing underwire-style bras or extremely tight bras.  Air dry your nipples for 3-83minutes after each feeding.  Use only cotton bra pads to absorb leaked  breast milk. Leaking of breast milk between feedings is normal.  Use lanolin on your nipples after breastfeeding. Lanolin helps to maintain your skin's normal moisture barrier. If you use pure lanolin, you do not need to wash it off before feeding your baby again. Pure lanolin is not toxic to your baby. You may also hand express a few drops of breast milk and gently massage that milk into your nipples and allow the milk to air dry. In the first few weeks after giving birth, some women experience extremely full breasts (engorgement). Engorgement can make your breasts feel heavy, warm, and tender to the touch. Engorgement peaks within 3-5 days  after you give birth. The following recommendations can help ease engorgement:  Completely empty your breasts while breastfeeding or pumping. You may want to start by applying warm, moist heat (in the shower or with warm water-soaked hand towels) just before feeding or pumping. This increases circulation and helps the milk flow. If your baby does not completely empty your breasts while breastfeeding, pump any extra milk after he or she is finished.  Wear a snug bra (nursing or regular) or tank top for 1-2 days to signal your body to slightly decrease milk production.  Apply ice packs to your breasts, unless this is too uncomfortable for you.  Make sure that your baby is latched on and positioned properly while breastfeeding. If engorgement persists after 48 hours of following these recommendations, contact your health care provider or a Advertising copywriter. OVERALL HEALTH CARE RECOMMENDATIONS WHILE BREASTFEEDING  Eat healthy foods. Alternate between meals and snacks, eating 3 of each per day. Because what you eat affects your breast milk, some of the foods may make your baby more irritable than usual. Avoid eating these foods if you are sure that they are negatively affecting your baby.  Drink milk, fruit juice, and water to satisfy your thirst (about 10  glasses a day).  Rest often, relax, and continue to take your prenatal vitamins to prevent fatigue, stress, and anemia.  Continue breast self-awareness checks.  Avoid chewing and smoking tobacco. Chemicals from cigarettes that pass into breast milk and exposure to secondhand smoke may harm your baby.  Avoid alcohol and drug use, including marijuana. Some medicines that may be harmful to your baby can pass through breast milk. It is important to ask your health care provider before taking any medicine, including all over-the-counter and prescription medicine as well as vitamin and herbal supplements. It is possible to become pregnant while breastfeeding. If birth control is desired, ask your health care provider about options that will be safe for your baby. SEEK MEDICAL CARE IF:  You feel like you want to stop breastfeeding or have become frustrated with breastfeeding.  You have painful breasts or nipples.  Your nipples are cracked or bleeding.  Your breasts are red, tender, or warm.  You have a swollen area on either breast.  You have a fever or chills.  You have nausea or vomiting.  You have drainage other than breast milk from your nipples.  Your breasts do not become full before feedings by the fifth day after you give birth.  You feel sad and depressed.  Your baby is too sleepy to eat well.  Your baby is having trouble sleeping.   Your baby is wetting less than 3 diapers in a 24-hour period.  Your baby has less than 3 stools in a 24-hour period.  Your baby's skin or the white part of his or her eyes becomes yellow.   Your baby is not gaining weight by 21 days of age. SEEK IMMEDIATE MEDICAL CARE IF:  Your baby is overly tired (lethargic) and does not want to wake up and feed.  Your baby develops an unexplained fever.   This information is not intended to replace advice given to you by your health care provider. Make sure you discuss any questions you have with  your health care provider.   Document Released: 02/17/2005 Document Revised: 11/08/2014 Document Reviewed: 08/11/2012 Elsevier Interactive Patient Education 2016 [employer].  Preterm Labor Information Preterm labor is when labor starts at less than 37 weeks of pregnancy. The normal  length of a pregnancy is 39 to 41 weeks. CAUSES Often, there is no identifiable underlying cause as to why a woman goes into preterm labor. One of the most common known causes of preterm labor is infection. Infections of the uterus, cervix, vagina, amniotic sac, bladder, kidney, or even the lungs (pneumonia) can cause labor to start. Other suspected causes of preterm labor include:   Urogenital infections, such as yeast infections and bacterial vaginosis.   Uterine abnormalities (uterine shape, uterine septum, fibroids, or bleeding from the placenta).   A cervix that has been operated on (it may fail to stay closed).   Malformations in the fetus.   Multiple gestations (twins, triplets, and so on).   Breakage of the amniotic sac.  RISK FACTORS  Having a previous history of preterm labor.   Having premature rupture of membranes (PROM).   Having a placenta that covers the opening of the cervix (placenta previa).   Having a placenta that separates from the uterus (placental abruption).   Having a cervix that is too weak to hold the fetus in the uterus (incompetent cervix).   Having too much fluid in the amniotic sac (polyhydramnios).   Taking illegal drugs or smoking while pregnant.   Not gaining enough weight while pregnant.   Being younger than 48 and older than 28 years old.   Having a low socioeconomic status.   Being African American. SYMPTOMS Signs and symptoms of preterm labor include:   Menstrual-like cramps, abdominal pain, or back pain.  Uterine contractions that are regular, as frequent as six in an hour, regardless of their intensity (may be mild or  painful).  Contractions that start on the top of the uterus and spread down to the lower abdomen and back.   A sense of increased pelvic pressure.   A watery or bloody mucus discharge that comes from the vagina.  TREATMENT Depending on the length of the pregnancy and other circumstances, your health care provider may suggest bed rest. If necessary, there are medicines that can be given to stop contractions and to mature the fetal lungs. If labor happens before 34 weeks of pregnancy, a prolonged hospital stay may be recommended. Treatment depends on the condition of both you and the fetus.  WHAT SHOULD YOU DO IF YOU THINK YOU ARE IN PRETERM LABOR? Call your health care provider right away. You will need to go to the hospital to get checked immediately. HOW CAN YOU PREVENT PRETERM LABOR IN FUTURE PREGNANCIES? You should:   Stop smoking if you smoke.  Maintain healthy weight gain and avoid chemicals and drugs that are not necessary.  Be watchful for any type of infection.  Inform your health care provider if you have a known history of preterm labor.   This information is not intended to replace advice given to you by your health care provider. Make sure you discuss any questions you have with your health care provider.   Document Released: 05/10/2003 Document Revised: 10/20/2012 Document Reviewed: 03/22/2012 Elsevier Interactive Patient Education [employer]

## 2015-06-12 LAB — WET PREP, GENITAL
TRICH WET PREP: NONE SEEN
Yeast Wet Prep HPF POC: NONE SEEN

## 2015-06-13 ENCOUNTER — Telehealth: Payer: Self-pay | Admitting: General Practice

## 2015-06-13 DIAGNOSIS — N760 Acute vaginitis: Principal | ICD-10-CM

## 2015-06-13 DIAGNOSIS — B9689 Other specified bacterial agents as the cause of diseases classified elsewhere: Secondary | ICD-10-CM

## 2015-06-13 MED ORDER — METRONIDAZOLE 500 MG PO TABS: 500.0000 mg | ORAL_TABLET | Freq: Two times a day (BID) | ORAL | Status: DC

## 2015-06-13 NOTE — Telephone Encounter (Signed)
Per Dr Shawnie PonsPratt, patient has BV and needs Rx for flagyl. Called patient and informed her of results & Rx sent to pharmacy. Patient verbalized understanding & had no questions

## 2015-06-18 ENCOUNTER — Ambulatory Visit (INDEPENDENT_AMBULATORY_CARE_PROVIDER_SITE_OTHER): Payer: Medicaid Other | Admitting: Family Medicine

## 2015-06-18 ENCOUNTER — Encounter: Payer: Self-pay | Admitting: *Deleted

## 2015-06-18 VITALS — BP 111/76 | HR 86 | Wt 210.4 lb

## 2015-06-18 DIAGNOSIS — O2343 Unspecified infection of urinary tract in pregnancy, third trimester: Secondary | ICD-10-CM

## 2015-06-18 DIAGNOSIS — F331 Major depressive disorder, recurrent, moderate: Secondary | ICD-10-CM | POA: Diagnosis not present

## 2015-06-18 DIAGNOSIS — F1121 Opioid dependence, in remission: Secondary | ICD-10-CM | POA: Diagnosis not present

## 2015-06-18 DIAGNOSIS — O99330 Smoking (tobacco) complicating pregnancy, unspecified trimester: Secondary | ICD-10-CM | POA: Diagnosis not present

## 2015-06-18 DIAGNOSIS — F1120 Opioid dependence, uncomplicated: Secondary | ICD-10-CM | POA: Diagnosis not present

## 2015-06-18 DIAGNOSIS — F1020 Alcohol dependence, uncomplicated: Secondary | ICD-10-CM

## 2015-06-18 DIAGNOSIS — B951 Streptococcus, group B, as the cause of diseases classified elsewhere: Secondary | ICD-10-CM | POA: Diagnosis not present

## 2015-06-18 DIAGNOSIS — F1521 Other stimulant dependence, in remission: Secondary | ICD-10-CM

## 2015-06-18 DIAGNOSIS — O0993 Supervision of high risk pregnancy, unspecified, third trimester: Secondary | ICD-10-CM | POA: Diagnosis not present

## 2015-06-18 DIAGNOSIS — F17290 Nicotine dependence, other tobacco product, uncomplicated: Secondary | ICD-10-CM | POA: Diagnosis not present

## 2015-06-18 DIAGNOSIS — O10913 Unspecified pre-existing hypertension complicating pregnancy, third trimester: Secondary | ICD-10-CM | POA: Diagnosis not present

## 2015-06-18 DIAGNOSIS — F329 Major depressive disorder, single episode, unspecified: Secondary | ICD-10-CM | POA: Diagnosis not present

## 2015-06-18 DIAGNOSIS — O99343 Other mental disorders complicating pregnancy, third trimester: Secondary | ICD-10-CM | POA: Diagnosis not present

## 2015-06-18 DIAGNOSIS — O99323 Drug use complicating pregnancy, third trimester: Secondary | ICD-10-CM | POA: Diagnosis not present

## 2015-06-18 DIAGNOSIS — F1520 Other stimulant dependence, uncomplicated: Secondary | ICD-10-CM | POA: Diagnosis not present

## 2015-06-18 LAB — POCT URINALYSIS DIP (DEVICE)
BILIRUBIN URINE: NEGATIVE
Glucose, UA: NEGATIVE mg/dL
HGB URINE DIPSTICK: NEGATIVE
Ketones, ur: NEGATIVE mg/dL
NITRITE: NEGATIVE
PH: 6 (ref 5.0–8.0)
PROTEIN: NEGATIVE mg/dL
Specific Gravity, Urine: 1.01 (ref 1.005–1.030)
Urobilinogen, UA: 0.2 mg/dL (ref 0.0–1.0)

## 2015-06-18 LAB — OB RESULTS CONSOLE GBS: GBS: POSITIVE

## 2015-06-18 MED ORDER — CEPHALEXIN 500 MG PO CAPS: 500.0000 mg | ORAL_CAPSULE | Freq: Four times a day (QID) | ORAL | Status: DC

## 2015-06-18 NOTE — Progress Notes (Signed)
Subjective:  Haley Parks is a 30 y.o. G3P1011 at 6325w1d being seen today for ongoing prenatal care.  She is currently monitored for the following issues for this high-risk pregnancy and has Major depressive disorder, recurrent episode, moderate (HCC); PTSD (post-traumatic stress disorder); Anxiety state, unspecified; Opioid dependence in remission (HCC); Amphetamine dependence, in remission (HCC); Tobacco use affecting pregnancy, antepartum; Alcohol use disorder, severe, dependence (HCC); Supervision of high risk pregnancy, antepartum; H/O cryosurgery of cervix complicating pregnancy; Pre-existing hypertension complicating pregnancy; and UTI in pregnancy, antepartum on her problem list.  Patient reports Dysuria- for the past 2 days, taking AZO .  Contractions: Not present. Vag. Bleeding: None.  Movement: Present. Denies leaking of fluid.   The following portions of the patient's history were reviewed and updated as appropriate: allergies, current medications, past family history, past medical history, past social history, past surgical history and problem list. Problem list updated.  Objective:   Filed Vitals:   06/18/15 1141  BP: 111/76  Pulse: 86  Weight: 210 lb 6.4 oz (95.437 kg)    Fetal Status: Fetal Heart Rate (bpm): NST Fundal Height: 33 cm Movement: Present     General:  Alert, oriented and cooperative. Patient is in no acute distress.  Skin: Skin is warm and dry. No rash noted.   Cardiovascular: Normal heart rate noted  Respiratory: Normal respiratory effort, no problems with respiration noted  Abdomen: Soft, gravid, appropriate for gestational age. Pain/Pressure: Present     Pelvic: Vag. Bleeding: None     Cervical exam deferred        Extremities: Normal range of motion.  Edema: None  Mental Status: Normal mood and affect. Normal behavior. Normal judgment and thought content.   Urinalysis:      Assessment and Plan:  Pregnancy: G3P1011 at 6125w1d  1. Pre-existing  hypertension complicating pregnancy, third trimester - Fetal nonstress test- reactive.  - US MFM FETAL BPP WO NON STRESS; Future - US MFM FETAL BPP WO NON STRESS; Future  2. Alcohol use disorder, severe, dependence (HCC)  3. Amphetamine dependence, in remission (HCC)  4. Supervision of high risk pregnancy, antepartum, third trimester -updated pregnancy box  5. Opioid dependence in remission (HCC)  6. Major depressive disorder, recurrent episode, moderate (HCC)  7. Tobacco use affecting pregnancy, antepartum   8. UTI affecting pregnancy - Patient was treated with macrobid earlier this pregnancy and has been on this for suppression in the past. Last UCx was Ivan Croftcoli that was sensitive to macrobid.  - Keflex 500 QID for UTI - UA and UCx ordered.   Preterm labor symptoms and general obstetric precautions including but not limited to vaginal bleeding, contractions, leaking of fluid and fetal movement were reviewed in detail with the patient. Please refer to After Visit Summary for other counseling recommendations.  Return in about 3 days (around 06/21/2015) for as scheduled.   Federico FlakeKimberly Niles Cooper Moroney, MD

## 2015-06-18 NOTE — Patient Instructions (Signed)
Fetal Movement Counts  Patient Name: __________________________________________________ Patient Due Date: ____________________  Performing a fetal movement count is highly recommended in high-risk pregnancies, but it is good for every pregnant woman to do. Your health care provider may ask you to start counting fetal movements at 28 weeks of the pregnancy. Fetal movements often increase:  · After eating a full meal.  · After physical activity.  · After eating or drinking something sweet or cold.  · At rest.  Pay attention to when you feel the baby is most active. This will help you notice a pattern of your baby's sleep and wake cycles and what factors contribute to an increase in fetal movement. It is important to perform a fetal movement count at the same time each day when your baby is normally most active.   HOW TO COUNT FETAL MOVEMENTS  1. Find a quiet and comfortable area to sit or lie down on your left side. Lying on your left side provides the best blood and oxygen circulation to your baby.  2. Write down the day and time on a sheet of paper or in a journal.  3. Start counting kicks, flutters, swishes, rolls, or jabs in a 2-hour period. You should feel at least 10 movements within 2 hours.  4. If you do not feel 10 movements in 2 hours, wait 2-3 hours and count again. Look for a change in the pattern or not enough counts in 2 hours.  SEEK MEDICAL CARE IF:  · You feel less than 10 counts in 2 hours, tried twice.  · There is no movement in over an hour.  · The pattern is changing or taking longer each day to reach 10 counts in 2 hours.  · You feel the baby is not moving as he or she usually does.  Date: ____________ Movements: ____________ Start time: ____________ Finish time: ____________   Date: ____________ Movements: ____________ Start time: ____________ Finish time: ____________  Date: ____________ Movements: ____________ Start time: ____________ Finish time: ____________  Date: ____________ Movements:  ____________ Start time: ____________ Finish time: ____________  Date: ____________ Movements: ____________ Start time: ____________ Finish time: ____________  Date: ____________ Movements: ____________ Start time: ____________ Finish time: ____________  Date: ____________ Movements: ____________ Start time: ____________ Finish time: ____________  Date: ____________ Movements: ____________ Start time: ____________ Finish time: ____________   Date: ____________ Movements: ____________ Start time: ____________ Finish time: ____________  Date: ____________ Movements: ____________ Start time: ____________ Finish time: ____________  Date: ____________ Movements: ____________ Start time: ____________ Finish time: ____________  Date: ____________ Movements: ____________ Start time: ____________ Finish time: ____________  Date: ____________ Movements: ____________ Start time: ____________ Finish time: ____________  Date: ____________ Movements: ____________ Start time: ____________ Finish time: ____________  Date: ____________ Movements: ____________ Start time: ____________ Finish time: ____________   Date: ____________ Movements: ____________ Start time: ____________ Finish time: ____________  Date: ____________ Movements: ____________ Start time: ____________ Finish time: ____________  Date: ____________ Movements: ____________ Start time: ____________ Finish time: ____________  Date: ____________ Movements: ____________ Start time: ____________ Finish time: ____________  Date: ____________ Movements: ____________ Start time: ____________ Finish time: ____________  Date: ____________ Movements: ____________ Start time: ____________ Finish time: ____________  Date: ____________ Movements: ____________ Start time: ____________ Finish time: ____________   Date: ____________ Movements: ____________ Start time: ____________ Finish time: ____________  Date: ____________ Movements: ____________ Start time: ____________ Finish  time: ____________  Date: ____________ Movements: ____________ Start time: ____________ Finish time: ____________  Date: ____________ Movements: ____________ Start time:   ____________ Finish time: ____________  Date: ____________ Movements: ____________ Start time: ____________ Finish time: ____________  Date: ____________ Movements: ____________ Start time: ____________ Finish time: ____________  Date: ____________ Movements: ____________ Start time: ____________ Finish time: ____________   Date: ____________ Movements: ____________ Start time: ____________ Finish time: ____________  Date: ____________ Movements: ____________ Start time: ____________ Finish time: ____________  Date: ____________ Movements: ____________ Start time: ____________ Finish time: ____________  Date: ____________ Movements: ____________ Start time: ____________ Finish time: ____________  Date: ____________ Movements: ____________ Start time: ____________ Finish time: ____________  Date: ____________ Movements: ____________ Start time: ____________ Finish time: ____________  Date: ____________ Movements: ____________ Start time: ____________ Finish time: ____________   Date: ____________ Movements: ____________ Start time: ____________ Finish time: ____________  Date: ____________ Movements: ____________ Start time: ____________ Finish time: ____________  Date: ____________ Movements: ____________ Start time: ____________ Finish time: ____________  Date: ____________ Movements: ____________ Start time: ____________ Finish time: ____________  Date: ____________ Movements: ____________ Start time: ____________ Finish time: ____________  Date: ____________ Movements: ____________ Start time: ____________ Finish time: ____________  Date: ____________ Movements: ____________ Start time: ____________ Finish time: ____________   Date: ____________ Movements: ____________ Start time: ____________ Finish time: ____________  Date: ____________  Movements: ____________ Start time: ____________ Finish time: ____________  Date: ____________ Movements: ____________ Start time: ____________ Finish time: ____________  Date: ____________ Movements: ____________ Start time: ____________ Finish time: ____________  Date: ____________ Movements: ____________ Start time: ____________ Finish time: ____________  Date: ____________ Movements: ____________ Start time: ____________ Finish time: ____________  Date: ____________ Movements: ____________ Start time: ____________ Finish time: ____________   Date: ____________ Movements: ____________ Start time: ____________ Finish time: ____________  Date: ____________ Movements: ____________ Start time: ____________ Finish time: ____________  Date: ____________ Movements: ____________ Start time: ____________ Finish time: ____________  Date: ____________ Movements: ____________ Start time: ____________ Finish time: ____________  Date: ____________ Movements: ____________ Start time: ____________ Finish time: ____________  Date: ____________ Movements: ____________ Start time: ____________ Finish time: ____________     This information is not intended to replace advice given to you by your health care provider. Make sure you discuss any questions you have with your health care provider.     Document Released: 03/19/2006 Document Revised: 03/10/2014 Document Reviewed: 12/15/2011  [employer]vier Interactive Patient Education ©2016 [employer]

## 2015-06-18 NOTE — Progress Notes (Signed)
Pt reports burning and stinging w/urination x4 days. She took AZO yesterday w/relief.

## 2015-06-19 LAB — CULTURE, OB URINE: Colony Count: 30000

## 2015-06-20 MED ORDER — AMOXICILLIN 500 MG PO CAPS: 500.0000 mg | ORAL_CAPSULE | Freq: Three times a day (TID) | ORAL | Status: DC

## 2015-06-20 NOTE — Addendum Note (Signed)
Addended by: Geanie BerlinNEWTON, KIMBERLY N on: 06/20/2015 02:14 AM   Modules accepted: Orders, Medications

## 2015-06-21 ENCOUNTER — Other Ambulatory Visit: Payer: Self-pay

## 2015-06-21 ENCOUNTER — Telehealth: Payer: Self-pay | Admitting: General Practice

## 2015-06-21 ENCOUNTER — Ambulatory Visit (INDEPENDENT_AMBULATORY_CARE_PROVIDER_SITE_OTHER): Payer: Medicaid Other | Admitting: *Deleted

## 2015-06-21 VITALS — BP 120/82 | HR 90

## 2015-06-21 DIAGNOSIS — Z36 Encounter for antenatal screening of mother: Secondary | ICD-10-CM | POA: Diagnosis not present

## 2015-06-21 DIAGNOSIS — O10913 Unspecified pre-existing hypertension complicating pregnancy, third trimester: Secondary | ICD-10-CM | POA: Diagnosis not present

## 2015-06-21 NOTE — Telephone Encounter (Signed)
Per Dr Alvester MorinNewton, patient has UTI and antibiotic has been sent to her pharmacy. Called patient & informed her of results & medication sent to pharmacy. Patient verbalized understanding & had no questions

## 2015-06-21 NOTE — Progress Notes (Signed)
Pt advised to stop taking Keflex and begin Amoxicillin as ordered.

## 2015-06-25 ENCOUNTER — Ambulatory Visit (INDEPENDENT_AMBULATORY_CARE_PROVIDER_SITE_OTHER): Payer: Medicaid Other | Admitting: Obstetrics & Gynecology

## 2015-06-25 VITALS — BP 116/70 | HR 76 | Wt 212.3 lb

## 2015-06-25 DIAGNOSIS — O10913 Unspecified pre-existing hypertension complicating pregnancy, third trimester: Secondary | ICD-10-CM | POA: Diagnosis present

## 2015-06-25 DIAGNOSIS — O0993 Supervision of high risk pregnancy, unspecified, third trimester: Secondary | ICD-10-CM

## 2015-06-25 LAB — POCT URINALYSIS DIP (DEVICE)
Bilirubin Urine: NEGATIVE
Glucose, UA: NEGATIVE mg/dL
Hgb urine dipstick: NEGATIVE
KETONES UR: NEGATIVE mg/dL
Leukocytes, UA: NEGATIVE
Nitrite: NEGATIVE
PH: 6 (ref 5.0–8.0)
PROTEIN: NEGATIVE mg/dL
SPECIFIC GRAVITY, URINE: 1.01 (ref 1.005–1.030)
UROBILINOGEN UA: 0.2 mg/dL (ref 0.0–1.0)

## 2015-06-25 NOTE — Progress Notes (Signed)
NST reactive today. She left without being seen after testing

## 2015-06-25 NOTE — Progress Notes (Signed)
C/o a little braxton hicks contractions

## 2015-06-25 NOTE — Progress Notes (Signed)
4/20 NST reviewed and reactive

## 2015-06-28 ENCOUNTER — Other Ambulatory Visit: Payer: Self-pay | Admitting: Obstetrics & Gynecology

## 2015-06-28 ENCOUNTER — Ambulatory Visit (HOSPITAL_COMMUNITY)
Admission: RE | Admit: 2015-06-28 | Discharge: 2015-06-28 | Disposition: A | Discharge status: Home / Self Care | Payer: Medicaid Other | Source: Ambulatory Visit | Attending: Obstetrics & Gynecology | Admitting: Obstetrics & Gynecology

## 2015-06-28 ENCOUNTER — Encounter (HOSPITAL_COMMUNITY): Payer: Self-pay

## 2015-06-28 DIAGNOSIS — IMO0002 Reserved for concepts with insufficient information to code with codable children: Secondary | ICD-10-CM

## 2015-06-28 DIAGNOSIS — Z3A33 33 weeks gestation of pregnancy: Secondary | ICD-10-CM | POA: Insufficient documentation

## 2015-06-28 DIAGNOSIS — O10013 Pre-existing essential hypertension complicating pregnancy, third trimester: Secondary | ICD-10-CM | POA: Insufficient documentation

## 2015-06-28 DIAGNOSIS — O99323 Drug use complicating pregnancy, third trimester: Secondary | ICD-10-CM | POA: Insufficient documentation

## 2015-06-28 DIAGNOSIS — F1910 Other psychoactive substance abuse, uncomplicated: Secondary | ICD-10-CM | POA: Insufficient documentation

## 2015-06-28 DIAGNOSIS — O10913 Unspecified pre-existing hypertension complicating pregnancy, third trimester: Secondary | ICD-10-CM

## 2015-06-28 DIAGNOSIS — O0933 Supervision of pregnancy with insufficient antenatal care, third trimester: Secondary | ICD-10-CM | POA: Diagnosis not present

## 2015-06-28 DIAGNOSIS — O3443 Maternal care for other abnormalities of cervix, third trimester: Secondary | ICD-10-CM | POA: Diagnosis not present

## 2015-06-28 DIAGNOSIS — F1920 Other psychoactive substance dependence, uncomplicated: Secondary | ICD-10-CM

## 2015-07-02 ENCOUNTER — Ambulatory Visit (INDEPENDENT_AMBULATORY_CARE_PROVIDER_SITE_OTHER): Payer: Medicaid Other | Admitting: Obstetrics & Gynecology

## 2015-07-02 VITALS — BP 109/83 | HR 113 | Wt 210.0 lb

## 2015-07-02 DIAGNOSIS — O09293 Supervision of pregnancy with other poor reproductive or obstetric history, third trimester: Secondary | ICD-10-CM

## 2015-07-02 DIAGNOSIS — O10913 Unspecified pre-existing hypertension complicating pregnancy, third trimester: Secondary | ICD-10-CM | POA: Diagnosis not present

## 2015-07-02 DIAGNOSIS — O09299 Supervision of pregnancy with other poor reproductive or obstetric history, unspecified trimester: Secondary | ICD-10-CM | POA: Insufficient documentation

## 2015-07-02 DIAGNOSIS — O0993 Supervision of high risk pregnancy, unspecified, third trimester: Secondary | ICD-10-CM

## 2015-07-02 LAB — POCT URINALYSIS DIP (DEVICE)
BILIRUBIN URINE: NEGATIVE
GLUCOSE, UA: NEGATIVE mg/dL
Hgb urine dipstick: NEGATIVE
Ketones, ur: NEGATIVE mg/dL
Leukocytes, UA: NEGATIVE
NITRITE: NEGATIVE
PH: 6.5 (ref 5.0–8.0)
PROTEIN: NEGATIVE mg/dL
Specific Gravity, Urine: 1.025 (ref 1.005–1.030)
Urobilinogen, UA: 0.2 mg/dL (ref 0.0–1.0)

## 2015-07-02 NOTE — Progress Notes (Signed)
Patient requests refill on gabapentin  Breastfeeding discussed with patient

## 2015-07-03 NOTE — Progress Notes (Signed)
Subjective:  Haley PettiesLiana M Parks is a 30 y.o. G3P1011 at 3653w2d being seen today for ongoing prenatal care.  She is currently monitored for the following issues for this high-risk pregnancy and has Major depressive disorder, recurrent episode, moderate (HCC); PTSD (post-traumatic stress disorder); Anxiety state, unspecified; Opioid dependence in remission (HCC); Amphetamine dependence, in remission (HCC); Tobacco use affecting pregnancy, antepartum; Alcohol use disorder, severe, dependence (HCC); Supervision of high risk pregnancy, antepartum; H/O cryosurgery of cervix complicating pregnancy; Pre-existing hypertension complicating pregnancy; UTI in pregnancy, antepartum; and History of delivery by vacuum extraction, currently pregnant on her problem list.  Patient reports no complaints.  Contractions: Not present. Vag. Bleeding: None.  Movement: Present. Denies leaking of fluid.   The following portions of the patient's history were reviewed and updated as appropriate: allergies, current medications, past family history, past medical history, past social history, past surgical history and problem list. Problem list updated.  Objective:   Filed Vitals:   07/02/15 0835  BP: 109/83  Pulse: 113  Weight: 210 lb (95.255 kg)    Fetal Status: Fetal Heart Rate (bpm): RNST   Movement: Present     General:  Alert, oriented and cooperative. Patient is in no acute distress.  Skin: Skin is warm and dry. No rash noted.   Cardiovascular: Normal heart rate noted  Respiratory: Normal respiratory effort, no problems with respiration noted  Abdomen: Soft, gravid, appropriate for gestational age. Pain/Pressure: Absent     Pelvic: Vag. Bleeding: None     Cervical exam deferred        Extremities: Normal range of motion.  Edema: Trace  Mental Status: Normal mood and affect. Normal behavior. Normal judgment and thought content.   Urinalysis: Urine Protein: Negative Urine Glucose: Negative  Assessment and Plan:   Pregnancy: G3P1011 at 4853w2d  1. Pre-existing hypertension complicating pregnancy, third trimester -no evidence of pre eclampsia.   - Fetal nonstress test - US MFM FETAL BPP WO NON STRESS; Future  2. History of delivery by vacuum extraction, currently pregnant, third trimester  3. History of substance abuse -monitor prn   Preterm labor symptoms and general obstetric precautions including but not limited to vaginal bleeding, contractions, leaking of fluid and fetal movement were reviewed in detail with the patient. Please refer to After Visit Summary for other counseling recommendations.  Return in about 2 weeks (around 07/16/2015).   Lesly DukesKelly H Leggett, MD

## 2015-07-04 ENCOUNTER — Ambulatory Visit (HOSPITAL_COMMUNITY)
Admission: RE | Admit: 2015-07-04 | Discharge: 2015-07-04 | Disposition: A | Discharge status: Home / Self Care | Payer: Medicaid Other | Source: Ambulatory Visit | Attending: Family Medicine | Admitting: Family Medicine

## 2015-07-04 ENCOUNTER — Other Ambulatory Visit (HOSPITAL_COMMUNITY): Payer: Self-pay | Admitting: Maternal and Fetal Medicine

## 2015-07-04 ENCOUNTER — Other Ambulatory Visit: Payer: Self-pay | Admitting: Obstetrics & Gynecology

## 2015-07-04 ENCOUNTER — Encounter (HOSPITAL_COMMUNITY): Payer: Self-pay

## 2015-07-04 DIAGNOSIS — Z3A34 34 weeks gestation of pregnancy: Secondary | ICD-10-CM

## 2015-07-04 DIAGNOSIS — O3113X0 Continuing pregnancy after spontaneous abortion of one fetus or more, third trimester, not applicable or unspecified: Secondary | ICD-10-CM | POA: Insufficient documentation

## 2015-07-04 DIAGNOSIS — O3443 Maternal care for other abnormalities of cervix, third trimester: Secondary | ICD-10-CM

## 2015-07-04 DIAGNOSIS — O0933 Supervision of pregnancy with insufficient antenatal care, third trimester: Secondary | ICD-10-CM | POA: Diagnosis not present

## 2015-07-04 DIAGNOSIS — O99323 Drug use complicating pregnancy, third trimester: Secondary | ICD-10-CM

## 2015-07-04 DIAGNOSIS — F1920 Other psychoactive substance dependence, uncomplicated: Secondary | ICD-10-CM

## 2015-07-04 DIAGNOSIS — IMO0002 Reserved for concepts with insufficient information to code with codable children: Secondary | ICD-10-CM

## 2015-07-04 DIAGNOSIS — O10913 Unspecified pre-existing hypertension complicating pregnancy, third trimester: Secondary | ICD-10-CM

## 2015-07-04 DIAGNOSIS — O10013 Pre-existing essential hypertension complicating pregnancy, third trimester: Secondary | ICD-10-CM

## 2015-07-04 DIAGNOSIS — O30003 Twin pregnancy, unspecified number of placenta and unspecified number of amniotic sacs, third trimester: Secondary | ICD-10-CM | POA: Diagnosis present

## 2015-07-04 DIAGNOSIS — O10919 Unspecified pre-existing hypertension complicating pregnancy, unspecified trimester: Secondary | ICD-10-CM

## 2015-07-08 ENCOUNTER — Other Ambulatory Visit: Payer: Self-pay | Admitting: Family

## 2015-07-09 ENCOUNTER — Encounter: Payer: Self-pay | Admitting: Obstetrics and Gynecology

## 2015-07-09 ENCOUNTER — Ambulatory Visit (INDEPENDENT_AMBULATORY_CARE_PROVIDER_SITE_OTHER): Payer: Medicaid Other | Admitting: Obstetrics and Gynecology

## 2015-07-09 VITALS — BP 113/65 | HR 88 | Wt 213.6 lb

## 2015-07-09 DIAGNOSIS — R8271 Bacteriuria: Secondary | ICD-10-CM

## 2015-07-09 DIAGNOSIS — O3443 Maternal care for other abnormalities of cervix, third trimester: Secondary | ICD-10-CM

## 2015-07-09 DIAGNOSIS — O0993 Supervision of high risk pregnancy, unspecified, third trimester: Secondary | ICD-10-CM

## 2015-07-09 DIAGNOSIS — O10913 Unspecified pre-existing hypertension complicating pregnancy, third trimester: Secondary | ICD-10-CM | POA: Diagnosis not present

## 2015-07-09 DIAGNOSIS — Z9889 Other specified postprocedural states: Secondary | ICD-10-CM

## 2015-07-09 DIAGNOSIS — O09293 Supervision of pregnancy with other poor reproductive or obstetric history, third trimester: Secondary | ICD-10-CM | POA: Diagnosis not present

## 2015-07-09 LAB — POCT URINALYSIS DIP (DEVICE)
BILIRUBIN URINE: NEGATIVE
Glucose, UA: NEGATIVE mg/dL
HGB URINE DIPSTICK: NEGATIVE
Ketones, ur: NEGATIVE mg/dL
LEUKOCYTES UA: NEGATIVE
NITRITE: NEGATIVE
Protein, ur: NEGATIVE mg/dL
Specific Gravity, Urine: 1.015 (ref 1.005–1.030)
Urobilinogen, UA: 1 mg/dL (ref 0.0–1.0)
pH: 7 (ref 5.0–8.0)

## 2015-07-09 NOTE — Progress Notes (Signed)
Subjective:  Lowella PettiesLiana M Fatima is a 30 y.o. G3P1011 at 334w1d being seen today for ongoing prenatal care.  She is currently monitored for the following issues for this high-risk pregnancy and has Major depressive disorder, recurrent episode, moderate (HCC); PTSD (post-traumatic stress disorder); Anxiety state, unspecified; Opioid dependence in remission (HCC); Amphetamine dependence, in remission (HCC); Tobacco use affecting pregnancy, antepartum; Alcohol use disorder, severe, dependence (HCC); Supervision of high risk pregnancy, antepartum; H/O cryosurgery of cervix complicating pregnancy; Pre-existing hypertension complicating pregnancy; UTI in pregnancy, antepartum; History of delivery by vacuum extraction, currently pregnant; and GBS bacteriuria on her problem list.  Patient reports no complaints.  Contractions: Not present. Vag. Bleeding: None.  Movement: Present. Denies leaking of fluid.   The following portions of the patient's history were reviewed and updated as appropriate: allergies, current medications, past family history, past medical history, past social history, past surgical history and problem list. Problem list updated.  Objective:   Filed Vitals:   07/09/15 1014  BP: 113/65  Pulse: 88  Weight: 213 lb 9.6 oz (96.888 kg)    Fetal Status: Fetal Heart Rate (bpm): NST   Movement: Present     General:  Alert, oriented and cooperative. Patient is in no acute distress.  Skin: Skin is warm and dry. No rash noted.   Cardiovascular: Normal heart rate noted  Respiratory: Normal respiratory effort, no problems with respiration noted  Abdomen: Soft, gravid, appropriate for gestational age. Pain/Pressure: Absent     Pelvic: Vag. Bleeding: None     Cervical exam deferred        Extremities: Normal range of motion.  Edema: None  Mental Status: Normal mood and affect. Normal behavior. Normal judgment and thought content.   Urinalysis: Urine Protein: Negative Urine Glucose:  Negative  Assessment and Plan:  Pregnancy: G3P1011 at 714w1d  1. Pre-existing hypertension complicating pregnancy, third trimester Normotensive on no meds - Fetal nonstress test- reviewed and reactive  2. Supervision of high risk pregnancy, antepartum, third trimester   3. H/O cryosurgery of cervix complicating pregnancy, third trimester   4. History of delivery by vacuum extraction, currently pregnant, third trimester   5. GBS bacteriuria Will need prophylaxis in labor  Preterm labor symptoms and general obstetric precautions including but not limited to vaginal bleeding, contractions, leaking of fluid and fetal movement were reviewed in detail with the patient. Please refer to After Visit Summary for other counseling recommendations.  Return in about 1 week (around 07/16/2015).   Catalina AntiguaPeggy Eyla Tallon, MD

## 2015-07-12 ENCOUNTER — Other Ambulatory Visit: Payer: Self-pay

## 2015-07-16 ENCOUNTER — Encounter: Payer: Self-pay | Admitting: *Deleted

## 2015-07-16 ENCOUNTER — Ambulatory Visit (INDEPENDENT_AMBULATORY_CARE_PROVIDER_SITE_OTHER): Payer: Medicaid Other | Admitting: Family Medicine

## 2015-07-16 ENCOUNTER — Other Ambulatory Visit (HOSPITAL_COMMUNITY)
Admission: RE | Admit: 2015-07-16 | Discharge: 2015-07-16 | Disposition: A | Discharge status: Home / Self Care | Payer: Medicaid Other | Source: Ambulatory Visit | Attending: Obstetrics and Gynecology | Admitting: Obstetrics and Gynecology

## 2015-07-16 VITALS — BP 116/69 | HR 99 | Wt 213.9 lb

## 2015-07-16 DIAGNOSIS — Z113 Encounter for screening for infections with a predominantly sexual mode of transmission: Secondary | ICD-10-CM | POA: Diagnosis not present

## 2015-07-16 DIAGNOSIS — O10913 Unspecified pre-existing hypertension complicating pregnancy, third trimester: Secondary | ICD-10-CM | POA: Diagnosis present

## 2015-07-16 LAB — POCT URINALYSIS DIP (DEVICE)
Bilirubin Urine: NEGATIVE
GLUCOSE, UA: NEGATIVE mg/dL
Hgb urine dipstick: NEGATIVE
Ketones, ur: NEGATIVE mg/dL
Leukocytes, UA: NEGATIVE
NITRITE: NEGATIVE
PH: 7 (ref 5.0–8.0)
PROTEIN: NEGATIVE mg/dL
Specific Gravity, Urine: 1.015 (ref 1.005–1.030)
Urobilinogen, UA: 0.2 mg/dL (ref 0.0–1.0)

## 2015-07-16 LAB — OB RESULTS CONSOLE GC/CHLAMYDIA: Gonorrhea: NEGATIVE

## 2015-07-16 NOTE — Addendum Note (Signed)
Addended by: Rockwell GermanyFIELDS, Felita Bump A on: 07/16/2015 11:44 AM   Modules accepted: Orders

## 2015-07-16 NOTE — Progress Notes (Signed)
Patient left before being seen by provider.  NSt reactive.

## 2015-07-16 NOTE — Progress Notes (Signed)
Pt states she has a cold and si taking OTC meds - Robitussin, Sudafed.  BPP scheduled 5/18

## 2015-07-16 NOTE — Progress Notes (Signed)
Pt got NST today with Diane Day RN, but patient did not see DR. Patient said that she had to take her son to a Dr appt in [place] and did not have time to stay.

## 2015-07-17 LAB — URINE CYTOLOGY ANCILLARY ONLY
Chlamydia: NEGATIVE
NEISSERIA GONORRHEA: NEGATIVE

## 2015-07-19 ENCOUNTER — Ambulatory Visit (HOSPITAL_COMMUNITY): Payer: Self-pay

## 2015-07-23 ENCOUNTER — Ambulatory Visit (INDEPENDENT_AMBULATORY_CARE_PROVIDER_SITE_OTHER): Payer: Medicaid Other | Admitting: Obstetrics & Gynecology

## 2015-07-23 VITALS — BP 107/76 | HR 85 | Wt 212.6 lb

## 2015-07-23 DIAGNOSIS — F17210 Nicotine dependence, cigarettes, uncomplicated: Secondary | ICD-10-CM | POA: Diagnosis not present

## 2015-07-23 DIAGNOSIS — O99333 Smoking (tobacco) complicating pregnancy, third trimester: Secondary | ICD-10-CM | POA: Diagnosis not present

## 2015-07-23 DIAGNOSIS — O10913 Unspecified pre-existing hypertension complicating pregnancy, third trimester: Secondary | ICD-10-CM

## 2015-07-23 DIAGNOSIS — F1190 Opioid use, unspecified, uncomplicated: Secondary | ICD-10-CM | POA: Diagnosis not present

## 2015-07-23 DIAGNOSIS — O99323 Drug use complicating pregnancy, third trimester: Secondary | ICD-10-CM

## 2015-07-23 LAB — POCT URINALYSIS DIP (DEVICE)
Bilirubin Urine: NEGATIVE
GLUCOSE, UA: NEGATIVE mg/dL
Hgb urine dipstick: NEGATIVE
Ketones, ur: NEGATIVE mg/dL
Leukocytes, UA: NEGATIVE
NITRITE: NEGATIVE
PROTEIN: NEGATIVE mg/dL
Specific Gravity, Urine: 1.02 (ref 1.005–1.030)
UROBILINOGEN UA: 1 mg/dL (ref 0.0–1.0)
pH: 7 (ref 5.0–8.0)

## 2015-07-23 MED ORDER — AMOXICILLIN 875 MG PO TABS: 875.0000 mg | ORAL_TABLET | Freq: Two times a day (BID) | ORAL | Status: DC

## 2015-07-23 MED ORDER — BUDESONIDE 32 MCG/ACT NA SUSP: 2.0000 | Freq: Every day | NASAL | Status: AC

## 2015-07-23 NOTE — Progress Notes (Signed)
Pt has had URI x3 weeks - productive cough, stuffy nose - taking OTC meds with some relief. She states she feels like crap.

## 2015-07-24 NOTE — Progress Notes (Signed)
Subjective:  Haley Parks is a 30 y.o. G3P1011 at 7377w2d being seen today for ongoing prenatal care.  She is currently monitored for the following issues for this high-risk pregnancy and has Major depressive disorder, recurrent episode, moderate (HCC); PTSD (post-traumatic stress disorder); Anxiety state, unspecified; Opioid dependence in remission (HCC); Amphetamine dependence, in remission (HCC); Tobacco use affecting pregnancy, antepartum; Alcohol use disorder, severe, dependence (HCC); Supervision of high risk pregnancy, antepartum; H/O cryosurgery of cervix complicating pregnancy; Pre-existing hypertension complicating pregnancy; UTI in pregnancy, antepartum; History of delivery by vacuum extraction, currently pregnant; and GBS bacteriuria on her problem list.  Patient reports no complaints.  Contractions: Irregular. Vag. Bleeding: None.  Movement: Present. Denies leaking of fluid.   The following portions of the patient's history were reviewed and updated as appropriate: allergies, current medications, past family history, past medical history, past social history, past surgical history and problem list. Problem list updated.  Objective:   Filed Vitals:   07/23/15 1129  BP: 107/76  Pulse: 85  Weight: 212 lb 9.6 oz (96.435 kg)    Fetal Status: Fetal Heart Rate (bpm): NST Fundal Height: 34 cm Movement: Present     General:  Alert, oriented and cooperative. Patient is in no acute distress.  Skin: Skin is warm and dry. No rash noted.   Cardiovascular: Normal heart rate noted  Respiratory: Normal respiratory effort, no problems with respiration noted  Abdomen: Soft, gravid, appropriate for gestational age. Pain/Pressure: Present     Pelvic: Vag. Bleeding: None     Cervical exam deferred        Extremities: Normal range of motion.  Edema: None  Mental Status: Normal mood and affect. Normal behavior. Normal judgment and thought content.   Urinalysis: Urine Protein: Negative Urine Glucose:  Negative  Assessment and Plan:  Pregnancy: G3P1011 at 5977w2d  1. Pre-existing hypertension complicating pregnancy, third trimester Reactive NST today, continue 2x week testing - Fetal nonstress test  Term labor symptoms and general obstetric precautions including but not limited to vaginal bleeding, contractions, leaking of fluid and fetal movement were reviewed in detail with the patient. Please refer to After Visit Summary for other counseling recommendations.  Return in about 2 days (around 07/25/2015) for 2x/wk as scheduled.   Lesly DukesKelly H Bernadine Melecio, MD

## 2015-07-26 ENCOUNTER — Other Ambulatory Visit: Payer: Self-pay

## 2015-07-30 ENCOUNTER — Encounter (HOSPITAL_COMMUNITY): Payer: Self-pay | Admitting: *Deleted

## 2015-07-30 ENCOUNTER — Inpatient Hospital Stay (HOSPITAL_COMMUNITY)
Admission: AD | Admit: 2015-07-30 | Discharge: 2015-07-30 | Disposition: A | Discharge status: Home / Self Care | Payer: Medicaid Other | Source: Ambulatory Visit | Attending: Family Medicine | Admitting: Family Medicine

## 2015-07-30 DIAGNOSIS — O0993 Supervision of high risk pregnancy, unspecified, third trimester: Secondary | ICD-10-CM

## 2015-07-30 DIAGNOSIS — O99330 Smoking (tobacco) complicating pregnancy, unspecified trimester: Secondary | ICD-10-CM

## 2015-07-30 DIAGNOSIS — Z9889 Other specified postprocedural states: Secondary | ICD-10-CM

## 2015-07-30 DIAGNOSIS — R8271 Bacteriuria: Secondary | ICD-10-CM

## 2015-07-30 DIAGNOSIS — Z3493 Encounter for supervision of normal pregnancy, unspecified, third trimester: Secondary | ICD-10-CM | POA: Insufficient documentation

## 2015-07-30 DIAGNOSIS — O3443 Maternal care for other abnormalities of cervix, third trimester: Secondary | ICD-10-CM

## 2015-07-30 DIAGNOSIS — O2343 Unspecified infection of urinary tract in pregnancy, third trimester: Secondary | ICD-10-CM

## 2015-07-30 NOTE — MAU Note (Signed)
Pt C/O uc's that started around 1400, denies LOF or bleeding.

## 2015-07-30 NOTE — Discharge Instructions (Signed)
Braxton Hicks Contractions °Contractions of the uterus can occur throughout pregnancy. Contractions are not always a sign that you are in labor.  °WHAT ARE BRAXTON HICKS CONTRACTIONS?  °Contractions that occur before labor are called Braxton Hicks contractions, or false labor. Toward the end of pregnancy (32-34 weeks), these contractions can develop more often and may become more forceful. This is not true labor because these contractions do not result in opening (dilatation) and thinning of the cervix. They are sometimes difficult to tell apart from true labor because these contractions can be forceful and people have different pain tolerances. You should not feel embarrassed if you go to the hospital with false labor. Sometimes, the only way to tell if you are in true labor is for your health care provider to look for changes in the cervix. °If there are no prenatal problems or other health problems associated with the pregnancy, it is completely safe to be sent home with false labor and await the onset of true labor. °HOW CAN YOU TELL THE DIFFERENCE BETWEEN TRUE AND FALSE LABOR? °False Labor °· The contractions of false labor are usually shorter and not as hard as those of true labor.   °· The contractions are usually irregular.   °· The contractions are often felt in the front of the lower abdomen and in the groin.   °· The contractions may go away when you walk around or change positions while lying down.   °· The contractions get weaker and are shorter lasting as time goes on.   °· The contractions do not usually become progressively stronger, regular, and closer together as with true labor.   °True Labor °· Contractions in true labor last 30-70 seconds, become very regular, usually become more intense, and increase in frequency.   °· The contractions do not go away with walking.   °· The discomfort is usually felt in the top of the uterus and spreads to the lower abdomen and low back.   °· True labor can be  determined by your health care provider with an exam. This will show that the cervix is dilating and getting thinner.   °WHAT TO REMEMBER °· Keep up with your usual exercises and follow other instructions given by your health care provider.   °· Take medicines as directed by your health care provider.   °· Keep your regular prenatal appointments.   °· Eat and drink lightly if you think you are going into labor.   °· If Braxton Hicks contractions are making you uncomfortable:   °¨ Change your position from lying down or resting to walking, or from walking to resting.   °¨ Sit and rest in a tub of warm water.   °¨ Drink 2-3 glasses of water. Dehydration may cause these contractions.   °¨ Do slow and deep breathing several times an hour.   °WHEN SHOULD I SEEK IMMEDIATE MEDICAL CARE? °Seek immediate medical care if: °· Your contractions become stronger, more regular, and closer together.   °· You have fluid leaking or gushing from your vagina.   °· You have a fever.   °· You pass blood-tinged mucus.   °· You have vaginal bleeding.   °· You have continuous abdominal pain.   °· You have low back pain that you never had before.   °· You feel your baby's head pushing down and causing pelvic pressure.   °· Your baby is not moving as much as it used to.   °  °This information is not intended to replace advice given to you by your health care provider. Make sure you discuss any questions you have with your health care   provider. °  °Document Released: 02/17/2005 Document Revised: 02/22/2013 Document Reviewed: 11/29/2012 °Elsevier Interactive Patient Education ©2016 [employer] °Fetal Movement Counts °Patient Name: __________________________________________________ Patient Due Date: ____________________ °Performing a fetal movement count is highly recommended in high-risk pregnancies, but it is good for every pregnant woman to do. Your health care provider may ask you to start counting fetal movements at 28 weeks of the  pregnancy. Fetal movements often increase: °· After eating a full meal. °· After physical activity. °· After eating or drinking something sweet or cold. °· At rest. °Pay attention to when you feel the baby is most active. This will help you notice a pattern of your baby's sleep and wake cycles and what factors contribute to an increase in fetal movement. It is important to perform a fetal movement count at the same time each day when your baby is normally most active.  °HOW TO COUNT FETAL MOVEMENTS °· Find a quiet and comfortable area to sit or lie down on your left side. Lying on your left side provides the best blood and oxygen circulation to your baby. °· Write down the day and time on a sheet of paper or in a journal. °· Start counting kicks, flutters, swishes, rolls, or jabs in a 2-hour period. You should feel at least 10 movements within 2 hours. °· If you do not feel 10 movements in 2 hours, wait 2-3 hours and count again. Look for a change in the pattern or not enough counts in 2 hours. °SEEK MEDICAL CARE IF: °· You feel less than 10 counts in 2 hours, tried twice. °· There is no movement in over an hour. °· The pattern is changing or taking longer each day to reach 10 counts in 2 hours. °· You feel the baby is not moving as he or she usually does. °Date: ____________ Movements: ____________ Start time: ____________ Finish time: ____________  °Date: ____________ Movements: ____________ Start time: ____________ Finish time: ____________ °Date: ____________ Movements: ____________ Start time: ____________ Finish time: ____________ °Date: ____________ Movements: ____________ Start time: ____________ Finish time: ____________ °Date: ____________ Movements: ____________ Start time: ____________ Finish time: ____________ °Date: ____________ Movements: ____________ Start time: ____________ Finish time: ____________ °Date: ____________ Movements: ____________ Start time: ____________ Finish time: ____________ °Date:  ____________ Movements: ____________ Start time: ____________ Finish time: ____________  °Date: ____________ Movements: ____________ Start time: ____________ Finish time: ____________ °Date: ____________ Movements: ____________ Start time: ____________ Finish time: ____________ °Date: ____________ Movements: ____________ Start time: ____________ Finish time: ____________ °Date: ____________ Movements: ____________ Start time: ____________ Finish time: ____________ °Date: ____________ Movements: ____________ Start time: ____________ Finish time: ____________ °Date: ____________ Movements: ____________ Start time: ____________ Finish time: ____________ °Date: ____________ Movements: ____________ Start time: ____________ Finish time: ____________  °Date: ____________ Movements: ____________ Start time: ____________ Finish time: ____________ °Date: ____________ Movements: ____________ Start time: ____________ Finish time: ____________ °Date: ____________ Movements: ____________ Start time: ____________ Finish time: ____________ °Date: ____________ Movements: ____________ Start time: ____________ Finish time: ____________ °Date: ____________ Movements: ____________ Start time: ____________ Finish time: ____________ °Date: ____________ Movements: ____________ Start time: ____________ Finish time: ____________ °Date: ____________ Movements: ____________ Start time: ____________ Finish time: ____________  °Date: ____________ Movements: ____________ Start time: ____________ Finish time: ____________ °Date: ____________ Movements: ____________ Start time: ____________ Finish time: ____________ °Date: ____________ Movements: ____________ Start time: ____________ Finish time: ____________ °Date: ____________ Movements: ____________ Start time: ____________ Finish time: ____________ °Date: ____________ Movements: ____________ Start time: ____________ Finish time: ____________ °Date: ____________ Movements: ____________ Start  time: ____________ Finish time:   ____________ °Date: ____________ Movements: ____________ Start time: ____________ Finish time: ____________  °Date: ____________ Movements: ____________ Start time: ____________ Finish time: ____________ °Date: ____________ Movements: ____________ Start time: ____________ Finish time: ____________ °Date: ____________ Movements: ____________ Start time: ____________ Finish time: ____________ °Date: ____________ Movements: ____________ Start time: ____________ Finish time: ____________ °Date: ____________ Movements: ____________ Start time: ____________ Finish time: ____________ °Date: ____________ Movements: ____________ Start time: ____________ Finish time: ____________ °Date: ____________ Movements: ____________ Start time: ____________ Finish time: ____________  °Date: ____________ Movements: ____________ Start time: ____________ Finish time: ____________ °Date: ____________ Movements: ____________ Start time: ____________ Finish time: ____________ °Date: ____________ Movements: ____________ Start time: ____________ Finish time: ____________ °Date: ____________ Movements: ____________ Start time: ____________ Finish time: ____________ °Date: ____________ Movements: ____________ Start time: ____________ Finish time: ____________ °Date: ____________ Movements: ____________ Start time: ____________ Finish time: ____________ °Date: ____________ Movements: ____________ Start time: ____________ Finish time: ____________  °Date: ____________ Movements: ____________ Start time: ____________ Finish time: ____________ °Date: ____________ Movements: ____________ Start time: ____________ Finish time: ____________ °Date: ____________ Movements: ____________ Start time: ____________ Finish time: ____________ °Date: ____________ Movements: ____________ Start time: ____________ Finish time: ____________ °Date: ____________ Movements: ____________ Start time: ____________ Finish time:  ____________ °Date: ____________ Movements: ____________ Start time: ____________ Finish time: ____________ °Date: ____________ Movements: ____________ Start time: ____________ Finish time: ____________  °Date: ____________ Movements: ____________ Start time: ____________ Finish time: ____________ °Date: ____________ Movements: ____________ Start time: ____________ Finish time: ____________ °Date: ____________ Movements: ____________ Start time: ____________ Finish time: ____________ °Date: ____________ Movements: ____________ Start time: ____________ Finish time: ____________ °Date: ____________ Movements: ____________ Start time: ____________ Finish time: ____________ °Date: ____________ Movements: ____________ Start time: ____________ Finish time: ____________ °  °This information is not intended to replace advice given to you by your health care provider. Make sure you discuss any questions you have with your health care provider. °  °Document Released: 03/19/2006 Document Revised: 03/10/2014 Document Reviewed: 12/15/2011 °Elsevier Interactive Patient Education ©2016 [employer] Inc[employer] ° °

## 2015-07-31 ENCOUNTER — Ambulatory Visit (INDEPENDENT_AMBULATORY_CARE_PROVIDER_SITE_OTHER): Payer: Medicaid Other | Admitting: *Deleted

## 2015-07-31 VITALS — BP 123/80 | HR 86

## 2015-07-31 DIAGNOSIS — O10913 Unspecified pre-existing hypertension complicating pregnancy, third trimester: Secondary | ICD-10-CM | POA: Diagnosis present

## 2015-07-31 LAB — POCT URINALYSIS DIP (DEVICE)
Bilirubin Urine: NEGATIVE
Glucose, UA: NEGATIVE mg/dL
HGB URINE DIPSTICK: NEGATIVE
Ketones, ur: NEGATIVE mg/dL
Leukocytes, UA: NEGATIVE
NITRITE: NEGATIVE
PH: 6.5 (ref 5.0–8.0)
PROTEIN: NEGATIVE mg/dL
Specific Gravity, Urine: 1.01 (ref 1.005–1.030)
UROBILINOGEN UA: 0.2 mg/dL (ref 0.0–1.0)

## 2015-07-31 NOTE — Progress Notes (Signed)
5/30 NST reviewed and reactive 

## 2015-08-01 ENCOUNTER — Encounter: Payer: Self-pay | Admitting: General Practice

## 2015-08-01 ENCOUNTER — Telehealth (HOSPITAL_COMMUNITY): Payer: Self-pay | Admitting: *Deleted

## 2015-08-01 NOTE — Telephone Encounter (Signed)
Preadmission screen  

## 2015-08-02 ENCOUNTER — Ambulatory Visit (INDEPENDENT_AMBULATORY_CARE_PROVIDER_SITE_OTHER): Payer: Medicaid Other | Admitting: *Deleted

## 2015-08-02 VITALS — BP 117/79 | HR 93

## 2015-08-02 DIAGNOSIS — Z36 Encounter for antenatal screening of mother: Secondary | ICD-10-CM | POA: Diagnosis not present

## 2015-08-02 DIAGNOSIS — O10913 Unspecified pre-existing hypertension complicating pregnancy, third trimester: Secondary | ICD-10-CM

## 2015-08-06 NOTE — Progress Notes (Signed)
NST Note Date: 08/02/15 Gestational Age: 28/4 FHT: 130 baseline, +accels, no decel, mod variability Toco: rare contractions  A/P: rNST. Continue current plan of care  Cornelia Copaharlie Kaliyan Osbourn, Jr MD Attending [facility](Faculty Practice)

## 2015-08-07 ENCOUNTER — Ambulatory Visit (INDEPENDENT_AMBULATORY_CARE_PROVIDER_SITE_OTHER): Payer: Medicaid Other | Admitting: *Deleted

## 2015-08-07 VITALS — BP 118/72 | HR 96

## 2015-08-07 DIAGNOSIS — O10913 Unspecified pre-existing hypertension complicating pregnancy, third trimester: Secondary | ICD-10-CM | POA: Diagnosis not present

## 2015-08-07 NOTE — Progress Notes (Signed)
6/6 NST reviewed and reactive

## 2015-08-09 ENCOUNTER — Ambulatory Visit (INDEPENDENT_AMBULATORY_CARE_PROVIDER_SITE_OTHER): Payer: Medicaid Other | Admitting: Obstetrics and Gynecology

## 2015-08-09 VITALS — BP 116/84 | HR 96 | Wt 216.3 lb

## 2015-08-09 DIAGNOSIS — R8271 Bacteriuria: Secondary | ICD-10-CM

## 2015-08-09 DIAGNOSIS — Z36 Encounter for antenatal screening of mother: Secondary | ICD-10-CM | POA: Diagnosis not present

## 2015-08-09 DIAGNOSIS — O99330 Smoking (tobacco) complicating pregnancy, unspecified trimester: Secondary | ICD-10-CM

## 2015-08-09 DIAGNOSIS — F1121 Opioid dependence, in remission: Secondary | ICD-10-CM | POA: Diagnosis not present

## 2015-08-09 DIAGNOSIS — N390 Urinary tract infection, site not specified: Secondary | ICD-10-CM

## 2015-08-09 DIAGNOSIS — O10913 Unspecified pre-existing hypertension complicating pregnancy, third trimester: Secondary | ICD-10-CM | POA: Diagnosis present

## 2015-08-09 DIAGNOSIS — Z9889 Other specified postprocedural states: Secondary | ICD-10-CM

## 2015-08-09 DIAGNOSIS — O2343 Unspecified infection of urinary tract in pregnancy, third trimester: Secondary | ICD-10-CM

## 2015-08-09 DIAGNOSIS — O99323 Drug use complicating pregnancy, third trimester: Secondary | ICD-10-CM | POA: Diagnosis not present

## 2015-08-09 DIAGNOSIS — F17290 Nicotine dependence, other tobacco product, uncomplicated: Secondary | ICD-10-CM

## 2015-08-09 DIAGNOSIS — O99333 Smoking (tobacco) complicating pregnancy, third trimester: Secondary | ICD-10-CM

## 2015-08-09 DIAGNOSIS — O0993 Supervision of high risk pregnancy, unspecified, third trimester: Secondary | ICD-10-CM

## 2015-08-09 DIAGNOSIS — O3443 Maternal care for other abnormalities of cervix, third trimester: Secondary | ICD-10-CM | POA: Diagnosis not present

## 2015-08-09 LAB — POCT URINALYSIS DIP (DEVICE)
BILIRUBIN URINE: NEGATIVE
GLUCOSE, UA: NEGATIVE mg/dL
Ketones, ur: NEGATIVE mg/dL
NITRITE: POSITIVE — AB
Protein, ur: NEGATIVE mg/dL
Specific Gravity, Urine: 1.02 (ref 1.005–1.030)
Urobilinogen, UA: 0.2 mg/dL (ref 0.0–1.0)
pH: 6 (ref 5.0–8.0)

## 2015-08-09 NOTE — Progress Notes (Signed)
Pt reports increased episodes of indigestion - advised may take OTC Znatac.  Udip w/positive Nitrite and Large Leukocytes today - sent for culture.  IOL scheduled 6/11

## 2015-08-09 NOTE — Progress Notes (Signed)
Subjective:  Lowella PettiesLiana M Penland is a 30 y.o. G3P1011 at 8764w4d being seen today for ongoing prenatal care.  She is currently monitored for the following issues for this high-risk pregnancy and has Major depressive disorder, recurrent episode, moderate (HCC); PTSD (post-traumatic stress disorder); Anxiety state, unspecified; Opioid dependence in remission (HCC); Amphetamine dependence, in remission (HCC); Tobacco use affecting pregnancy, antepartum; Alcohol use disorder, severe, dependence (HCC); Supervision of high risk pregnancy, antepartum; H/O cryosurgery of cervix complicating pregnancy; Pre-existing hypertension complicating pregnancy; UTI in pregnancy, antepartum; History of delivery by vacuum extraction, currently pregnant; and GBS bacteriuria on her problem list.  Patient reports no issues. Pt doing well. Still with slight cough but negative ROS and slightly productive for yellowish/green phlegm.  The following portions of the patient's history were reviewed and updated as appropriate: allergies, current medications, past family history, past medical history, past social history, past surgical history and problem list. Problem list updated.  Objective:   Filed Vitals:   08/09/15 0847  BP: 116/84  Pulse: 96  Weight: 216 lb 4.8 oz (98.113 kg)    Fetal Status: Fetal Heart Rate (bpm): NST   Movement: Present  Presentation: Vertex  General:  Alert, oriented and cooperative. Patient is in no acute distress.  Skin: Skin is warm and dry. No rash noted.   Cardiovascular: Normal heart rate noted  Respiratory: Normal respiratory effort, no problems with respiration noted  Abdomen: Soft, gravid, appropriate for gestational age. Pain/Pressure: Present     Pelvic: Deferred   Extremities: Normal range of motion.  Edema: None  Mental Status: Normal mood and affect. Normal behavior. Normal judgment and thought content.   Urinalysis: Urine Protein: Negative Urine Glucose: Negative  Assessment and Plan:   Pregnancy: G3P1011 at 3764w4d  1. Pre-existing hypertension complicating pregnancy, third trimester Pt on no medsRNST, normal AFI, cephalic today. Set up for 6/11 IOL already.   2. Asymptomatic bacteriuria of pregnancy, third trimester Pt to try and void today for TOC - Culture, OB Urine  3. Supervision of high risk pregnancy, antepartum, third trimester Routine care. BC options d/w pt  4. Tobacco use affecting pregnancy, antepartum See above  5. GBS bacteriuria tx in labor. Will try for TOC today  6. Opioid dependence in remission (HCC) No issues. UDS on admission. Negative screen during pregnancy  7. H/O cryosurgery of cervix complicating pregnancy, third trimester No issues. May need help with dilation manually during labor  Term labor symptoms and general obstetric precautions including but not limited to vaginal bleeding, contractions, leaking of fluid and fetal movement were reviewed in detail with the patient. Please refer to After Visit Summary for other counseling recommendations.  Return in about 6 weeks (around 09/20/2015) for PP visit.  IOL on 6/11.   [place] Bingharlie Shaneal Barasch, MD

## 2015-08-11 LAB — CULTURE, OB URINE: Colony Count: >=100000

## 2015-08-12 ENCOUNTER — Inpatient Hospital Stay (HOSPITAL_COMMUNITY)
Admission: RE | Admit: 2015-08-12 | Discharge: 2015-08-15 | DRG: 774 | Disposition: A | Discharge status: Home / Self Care | Payer: Medicaid Other | Source: Ambulatory Visit | Attending: Family Medicine | Admitting: Family Medicine

## 2015-08-12 ENCOUNTER — Inpatient Hospital Stay (HOSPITAL_COMMUNITY): Payer: Medicaid Other | Admitting: Anesthesiology

## 2015-08-12 ENCOUNTER — Encounter (HOSPITAL_COMMUNITY): Payer: Self-pay

## 2015-08-12 VITALS — BP 119/77 | HR 75 | Temp 97.9°F | Resp 18 | Ht 70.0 in | Wt 217.0 lb

## 2015-08-12 DIAGNOSIS — F419 Anxiety disorder, unspecified: Secondary | ICD-10-CM | POA: Diagnosis present

## 2015-08-12 DIAGNOSIS — O10919 Unspecified pre-existing hypertension complicating pregnancy, unspecified trimester: Secondary | ICD-10-CM

## 2015-08-12 DIAGNOSIS — F17210 Nicotine dependence, cigarettes, uncomplicated: Secondary | ICD-10-CM | POA: Diagnosis present

## 2015-08-12 DIAGNOSIS — F331 Major depressive disorder, recurrent, moderate: Secondary | ICD-10-CM | POA: Diagnosis present

## 2015-08-12 DIAGNOSIS — O1092 Unspecified pre-existing hypertension complicating childbirth: Secondary | ICD-10-CM | POA: Diagnosis not present

## 2015-08-12 DIAGNOSIS — O99324 Drug use complicating childbirth: Secondary | ICD-10-CM | POA: Diagnosis present

## 2015-08-12 DIAGNOSIS — Z3A40 40 weeks gestation of pregnancy: Secondary | ICD-10-CM | POA: Diagnosis not present

## 2015-08-12 DIAGNOSIS — Z811 Family history of alcohol abuse and dependence: Secondary | ICD-10-CM | POA: Diagnosis not present

## 2015-08-12 DIAGNOSIS — F411 Generalized anxiety disorder: Secondary | ICD-10-CM | POA: Diagnosis present

## 2015-08-12 DIAGNOSIS — F1121 Opioid dependence, in remission: Secondary | ICD-10-CM | POA: Diagnosis present

## 2015-08-12 DIAGNOSIS — O1002 Pre-existing essential hypertension complicating childbirth: Principal | ICD-10-CM | POA: Diagnosis present

## 2015-08-12 DIAGNOSIS — O99824 Streptococcus B carrier state complicating childbirth: Secondary | ICD-10-CM | POA: Diagnosis present

## 2015-08-12 DIAGNOSIS — Z818 Family history of other mental and behavioral disorders: Secondary | ICD-10-CM

## 2015-08-12 DIAGNOSIS — O99330 Smoking (tobacco) complicating pregnancy, unspecified trimester: Secondary | ICD-10-CM | POA: Diagnosis present

## 2015-08-12 DIAGNOSIS — Z8249 Family history of ischemic heart disease and other diseases of the circulatory system: Secondary | ICD-10-CM

## 2015-08-12 DIAGNOSIS — O99334 Smoking (tobacco) complicating childbirth: Secondary | ICD-10-CM | POA: Diagnosis present

## 2015-08-12 DIAGNOSIS — F4310 Post-traumatic stress disorder, unspecified: Secondary | ICD-10-CM | POA: Diagnosis present

## 2015-08-12 DIAGNOSIS — O99344 Other mental disorders complicating childbirth: Secondary | ICD-10-CM | POA: Diagnosis present

## 2015-08-12 LAB — CBC
HCT: 33.8 % — ABNORMAL LOW (ref 36.0–46.0)
HCT: 34.8 % — ABNORMAL LOW (ref 36.0–46.0)
HCT: 33.7 % — ABNORMAL LOW (ref 36.0–46.0)
HEMOGLOBIN: 11.5 g/dL — AB (ref 12.0–15.0)
Hemoglobin: 11.6 g/dL — ABNORMAL LOW (ref 12.0–15.0)
Hemoglobin: 11.9 g/dL — ABNORMAL LOW (ref 12.0–15.0)
MCH: 32 pg (ref 26.0–34.0)
MCH: 31.2 pg (ref 26.0–34.0)
MCH: 31.4 pg (ref 26.0–34.0)
MCHC: 34.1 g/dL (ref 30.0–36.0)
MCHC: 34.2 g/dL (ref 30.0–36.0)
MCHC: 34.3 g/dL (ref 30.0–36.0)
MCV: 91.1 fL (ref 78.0–100.0)
MCV: 92.1 fL (ref 78.0–100.0)
MCV: 93.1 fL (ref 78.0–100.0)
PLATELETS: 122 10*3/uL — AB (ref 150–400)
PLATELETS: 130 10*3/uL — AB (ref 150–400)
Platelets: 119 10*3/uL — ABNORMAL LOW (ref 150–400)
RBC: 3.63 MIL/uL — ABNORMAL LOW (ref 3.87–5.11)
RBC: 3.82 MIL/uL — AB (ref 3.87–5.11)
RBC: 3.66 MIL/uL — AB (ref 3.87–5.11)
RDW: 13.4 % (ref 11.5–15.5)
RDW: 13.4 % (ref 11.5–15.5)
RDW: 13.4 % (ref 11.5–15.5)
WBC: 10.5 10*3/uL (ref 4.0–10.5)
WBC: 14.5 10*3/uL — ABNORMAL HIGH (ref 4.0–10.5)
WBC: 15.3 10*3/uL — AB (ref 4.0–10.5)

## 2015-08-12 LAB — RAPID URINE DRUG SCREEN, HOSP PERFORMED
Amphetamines: NOT DETECTED
BARBITURATES: NOT DETECTED
Benzodiazepines: NOT DETECTED
COCAINE: NOT DETECTED
Opiates: NOT DETECTED
Tetrahydrocannabinol: NOT DETECTED

## 2015-08-12 LAB — RPR: RPR Ser Ql: NONREACTIVE

## 2015-08-12 LAB — COMPREHENSIVE METABOLIC PANEL
ALK PHOS: 130 U/L — AB (ref 38–126)
ALT: 10 U/L — ABNORMAL LOW (ref 14–54)
ANION GAP: 6 (ref 5–15)
AST: 17 U/L (ref 15–41)
Albumin: 2.8 g/dL — ABNORMAL LOW (ref 3.5–5.0)
BILIRUBIN TOTAL: 0.7 mg/dL (ref 0.3–1.2)
BUN: 7 mg/dL (ref 6–20)
CALCIUM: 8.4 mg/dL — AB (ref 8.9–10.3)
CO2: 24 mmol/L (ref 22–32)
Chloride: 106 mmol/L (ref 101–111)
Creatinine, Ser: 0.5 mg/dL (ref 0.44–1.00)
Glucose, Bld: 72 mg/dL (ref 65–99)
Potassium: 4 mmol/L (ref 3.5–5.1)
Sodium: 136 mmol/L (ref 135–145)
TOTAL PROTEIN: 5.9 g/dL — AB (ref 6.5–8.1)

## 2015-08-12 LAB — ABO/RH: ABO/RH(D): O POS

## 2015-08-12 LAB — PROTEIN / CREATININE RATIO, URINE
CREATININE, URINE: 23 mg/dL
Total Protein, Urine: <6 mg/dL

## 2015-08-12 LAB — TYPE AND SCREEN
ABO/RH(D): O POS
ANTIBODY SCREEN: NEGATIVE

## 2015-08-12 LAB — HIV ANTIBODY (ROUTINE TESTING W REFLEX): HIV SCREEN 4TH GENERATION: NONREACTIVE

## 2015-08-12 MED ORDER — LACTATED RINGERS IV SOLN
INTRAVENOUS | Status: DC
  Administered 2015-08-12: 300 mL via INTRAUTERINE

## 2015-08-12 MED ORDER — ONDANSETRON HCL 4 MG/2ML IJ SOLN
4.0000 mg | Freq: Four times a day (QID) | INTRAMUSCULAR | Status: DC | PRN
  Filled 2015-08-12: qty 2

## 2015-08-12 MED ORDER — PENICILLIN G POTASSIUM 5000000 UNITS IJ SOLR
5.0000 10*6.[IU] | Freq: Once | INTRAVENOUS | Status: AC
  Administered 2015-08-12: 5 10*6.[IU] via INTRAVENOUS
  Filled 2015-08-12: qty 5

## 2015-08-12 MED ORDER — OXYCODONE-ACETAMINOPHEN 5-325 MG PO TABS
1.0000 | ORAL_TABLET | ORAL | Status: DC | PRN
  Administered 2015-08-13 – 2015-08-14 (×5): 1 via ORAL
  Filled 2015-08-12 (×5): qty 1

## 2015-08-12 MED ORDER — FENTANYL CITRATE (PF) 100 MCG/2ML IJ SOLN: 50.0000 ug | INTRAMUSCULAR | Status: DC | PRN

## 2015-08-12 MED ORDER — PENICILLIN G POTASSIUM 5000000 UNITS IJ SOLR
2.5000 10*6.[IU] | INTRAVENOUS | Status: DC
  Administered 2015-08-12 – 2015-08-13 (×5): 2.5 10*6.[IU] via INTRAVENOUS
  Filled 2015-08-12 (×9): qty 2.5

## 2015-08-12 MED ORDER — MISOPROSTOL 25 MCG QUARTER TABLET
25.0000 ug | ORAL_TABLET | ORAL | Status: DC | PRN
  Administered 2015-08-12 (×2): 25 ug via VAGINAL
  Filled 2015-08-12 (×2): qty 0.25
  Filled 2015-08-12: qty 1

## 2015-08-12 MED ORDER — OXYTOCIN 40 UNITS IN LACTATED RINGERS INFUSION - SIMPLE MED
2.5000 [IU]/h | INTRAVENOUS | Status: DC
  Filled 2015-08-12: qty 1000

## 2015-08-12 MED ORDER — LIDOCAINE HCL (PF) 1 % IJ SOLN
INTRAMUSCULAR | Status: DC | PRN
  Administered 2015-08-12 (×2): 4 mL via EPIDURAL

## 2015-08-12 MED ORDER — LACTATED RINGERS IV SOLN
INTRAVENOUS | Status: DC
  Administered 2015-08-12 (×3): via INTRAVENOUS

## 2015-08-12 MED ORDER — OXYTOCIN 40 UNITS IN LACTATED RINGERS INFUSION - SIMPLE MED
1.0000 m[IU]/min | INTRAVENOUS | Status: DC
  Administered 2015-08-12: 2 m[IU]/min via INTRAVENOUS
  Administered 2015-08-13: 4 m[IU]/min via INTRAVENOUS
  Filled 2015-08-12: qty 1000

## 2015-08-12 MED ORDER — TERBUTALINE SULFATE 1 MG/ML IJ SOLN
0.2500 mg | Freq: Once | INTRAMUSCULAR | Status: DC | PRN
  Filled 2015-08-12: qty 1

## 2015-08-12 MED ORDER — OXYCODONE-ACETAMINOPHEN 5-325 MG PO TABS: 2.0000 | ORAL_TABLET | ORAL | Status: DC | PRN

## 2015-08-12 MED ORDER — EPHEDRINE 5 MG/ML INJ
10.0000 mg | INTRAVENOUS | Status: DC | PRN
  Filled 2015-08-12: qty 2

## 2015-08-12 MED ORDER — DEXTROSE 5 % IV SOLN
1.0000 g | Freq: Once | INTRAVENOUS | Status: AC
  Administered 2015-08-12: 1 g via INTRAVENOUS
  Filled 2015-08-12: qty 10

## 2015-08-12 MED ORDER — SODIUM CHLORIDE 0.9 % IV SOLN
Freq: Once | INTRAVENOUS | Status: AC
  Administered 2015-08-12: 03:00:00 via INTRAVENOUS

## 2015-08-12 MED ORDER — ACETAMINOPHEN 325 MG PO TABS: 650.0000 mg | ORAL_TABLET | ORAL | Status: DC | PRN

## 2015-08-12 MED ORDER — OXYTOCIN BOLUS FROM INFUSION
500.0000 mL | INTRAVENOUS | Status: DC
  Administered 2015-08-13: 500 mL via INTRAVENOUS

## 2015-08-12 MED ORDER — DIPHENHYDRAMINE HCL 50 MG/ML IJ SOLN
12.5000 mg | INTRAMUSCULAR | Status: AC | PRN
  Administered 2015-08-12 – 2015-08-13 (×3): 12.5 mg via INTRAVENOUS
  Filled 2015-08-12 (×2): qty 1

## 2015-08-12 MED ORDER — LACTATED RINGERS IV SOLN
500.0000 mL | INTRAVENOUS | Status: DC | PRN
  Administered 2015-08-12: 500 mL via INTRAVENOUS
  Administered 2015-08-12 – 2015-08-13 (×4): 250 mL via INTRAVENOUS

## 2015-08-12 MED ORDER — PHENYLEPHRINE 40 MCG/ML (10ML) SYRINGE FOR IV PUSH (FOR BLOOD PRESSURE SUPPORT)
80.0000 ug | PREFILLED_SYRINGE | INTRAVENOUS | Status: DC | PRN
  Filled 2015-08-12: qty 5
  Filled 2015-08-12: qty 10

## 2015-08-12 MED ORDER — FLEET ENEMA 7-19 GM/118ML RE ENEM: 1.0000 | ENEMA | RECTAL | Status: DC | PRN

## 2015-08-12 MED ORDER — FENTANYL 2.5 MCG/ML BUPIVACAINE 1/10 % EPIDURAL INFUSION (WH - ANES)
14.0000 mL/h | INTRAMUSCULAR | Status: DC | PRN
  Administered 2015-08-12 (×2): 14 mL/h via EPIDURAL
  Filled 2015-08-12 (×2): qty 125

## 2015-08-12 MED ORDER — HYDROXYZINE HCL 50 MG PO TABS
50.0000 mg | ORAL_TABLET | Freq: Four times a day (QID) | ORAL | Status: DC | PRN
  Filled 2015-08-12: qty 1

## 2015-08-12 MED ORDER — SOD CITRATE-CITRIC ACID 500-334 MG/5ML PO SOLN: 30.0000 mL | ORAL | Status: DC | PRN

## 2015-08-12 MED ORDER — LACTATED RINGERS IV SOLN
500.0000 mL | Freq: Once | INTRAVENOUS | Status: AC
  Administered 2015-08-12: 500 mL via INTRAVENOUS

## 2015-08-12 MED ORDER — LIDOCAINE HCL (PF) 1 % IJ SOLN
30.0000 mL | INTRAMUSCULAR | Status: DC | PRN
  Filled 2015-08-12: qty 30

## 2015-08-12 MED ORDER — PHENYLEPHRINE 40 MCG/ML (10ML) SYRINGE FOR IV PUSH (FOR BLOOD PRESSURE SUPPORT)
80.0000 ug | PREFILLED_SYRINGE | INTRAVENOUS | Status: DC | PRN
Start: 2015-08-12 — End: 2015-08-15
  Filled 2015-08-12: qty 5

## 2015-08-12 NOTE — H&P (Signed)
LABOR ADMISSION HISTORY AND PHYSICAL  Haley Parks is a 30 y.o. female G3P1011 with IUP at [redacted]w[redacted]d by 6wk [place] presenting for IOL for cHTN. She reports irregular contractions far apart. Fetal movement present and at baseline. Denies fluid leak or gush, vaginal bleeding,  headaches, blurry vision, RUQ pain or peripheral edema. She plans on breast feeding. She request Nexplanon for birth control.  Plans on bringing child to [place] peds for pediatric care after discharge. Wanted to try Nitrous oxide and IV pains meds then epidural when she needs.  Dating: By 6wk [place] ---> Estimated Date of Delivery: 08/12/15  Sono: @[redacted]w[redacted]d , CWD, normal anatomy, cephalic presentation, longitudinal lie, 2804 g, 83% EFW. History of MDD, Anxiety and PTSD but not on medication during pregnancy  History of smoking, EtOH, Opioid and Amphetamine use, but she denies all except smoking during pregnancy.  Had cryosurgery on her cervix in 2008. She says this was done for precancerous cells found on her cervix.  She says her PAP after that are all normal.  Prenatal History/Complications: Clinic High Risk Prenatal Labs  Dating  6 wk Blood type: O/POS/-- (12/06 1343) O pos  Genetic Screen   Quad: negative   Antibody:NEG (12/06 1343)Neg  Anatomic [place] Normal Rubella: 3.27 (12/06 1343)Immune  GTT Early:  n/a          Third trimester: 91 RPR: NON REAC (03/16 1247) NR  Flu vaccine  02/06/15 HBsAg: NEGATIVE (12/06 1343) Neg  TDaP vaccine   05/17/15                                        HIV: NONREACTIVE (03/16 1247) NR  Baby Food   Breast                                  NUU:VOZDGUYQ (04/17 0000)bacteruria earlier in pregnancy  Contraception  Considering OCPs Pap:WNL  Circumcision  Yes   Pediatrician  Given list   Support Person  FOB    Past Medical History: Past Medical History  Diagnosis Date  . UTI (lower urinary tract infection)   . Substance abuse   . Alcohol abuse   . Anxiety   . Depression   . BV (bacterial vaginosis)   .  Vaginal Pap smear, abnormal     Past Surgical History: Past Surgical History  Procedure Laterality Date  . Carpel tunnel    . Wisdom tooth extraction    . Colonoscopy    . Cryotheraphy      Obstetrical History: OB History    Gravida Para Term Preterm AB TAB SAB Ectopic Multiple Living   3 1 1  0 1 1 0 0  1      Social History: Social History   Social History  . Marital Status: Divorced    Spouse Name: N/A  . Number of Children: N/A  . Years of Education: N/A   Social History Main Topics  . Smoking status: Former Smoker -- 0.25 packs/day    Types: Cigarettes    Quit date: 08/09/2015  . Smokeless tobacco: Never Used  . Alcohol Use: No     Comment: drinks weekly- stopped alcohol 09/2014  . Drug Use: No     Comment: quit September 06, 2013- opiates, amphetamines. Stopped cocaine 09/2014  . Sexual Activity: Not Currently    Birth Control/  Protection: None   Other Topics Concern  . None   Social History Narrative   ** Merged History Encounter **        Family History: Family History  Problem Relation Age of Onset  . Hypothyroidism Mother   . Irregular heart beat Mother   . Drug abuse Father   . Alcohol abuse Father   . Depression Father   . Drug abuse Maternal Grandmother   . Suicidality Neg Hx   . Heart disease Maternal Grandfather   . Cancer Maternal Grandfather   . Heart disease Paternal Grandfather     Allergies: No Known Allergies  Prescriptions prior to admission  Medication Sig Dispense Refill Last Dose  . acetaminophen (TYLENOL) 500 MG tablet Take 1,000 mg by mouth every 6 (six) hours as needed.   Taking  . albuterol (PROVENTIL HFA;VENTOLIN HFA) 108 (90 Base) MCG/ACT inhaler Inhale 2 puffs into the lungs every 6 (six) hours as needed for wheezing or shortness of breath. 1 Inhaler 2 Taking  . aspirin EC 81 MG tablet Take 1 tablet (81 mg total) by mouth daily. (Patient not taking: Reported on 07/23/2015) 90 tablet 3 Not Taking  . budesonide (RHINOCORT  AQUA) 32 MCG/ACT nasal spray Place 2 sprays into both nostrils daily. 1 Bottle 0 Taking  . Cetirizine HCl (ZYRTEC ALLERGY PO) Take by mouth. Reported on 07/23/2015   Taking  . gabapentin (NEURONTIN) 300 MG capsule TAKE 1 CAPSULE BY MOUTH AT BEDTIME. 30 capsule 1 Taking  . Prenatal Multivit-Min-Fe-FA (PRENATAL VITAMINS) 0.8 MG tablet Take 2 tablets by mouth daily. Gummies   Taking     Review of Systems  Blood pressure 131/85, pulse 88, temperature 97.7 F (36.5 C), temperature source Oral, resp. rate 16, last menstrual period 11/02/2014. GEN: appearance: alert, cooperative and appears stated age RESP: clear to auscultation bilaterally, no increased WOB CVS:: regular rate and rhythm, no murmurs, no sign of DVT, +2 DP GI: soft, non-tender; bowel sounds normal MSK: WWP, Homans sign is negative,  PSYCH: good affect  Pelvic Exam: Cervical exam: Dilation: Fingertip Effacement (%): 40 Station: -3 Exam by:: Gonfa Presentation: cephalic Uterine activity: None  Fetal monitoringBaseline: 120 bpm, Variability: Good {> 6 bpm), Accelerations: Reactive and Decelerations: Absent  Prenatal labs: ABO, Rh: O/POS/-- (12/06 1343) Antibody: NEG (12/06 1343) Rubella: immune RPR: NON REAC (03/16 1247)  HBsAg: NEGATIVE (12/06 1343)  HIV: NONREACTIVE (03/16 1247)  GBS: Positive (04/17 0000)  1 hr Glucola 91 Genetic screening QUAD negative Anatomy US normal  Prenatal Transfer Tool  Maternal Diabetes: No Genetic Screening: Normal Maternal Ultrasounds/Referrals: Normal Fetal Ultrasounds or other Referrals:  None Maternal Substance Abuse:  Yes. Smoked throughout pregnancy, about 1 cigarette a day. Denies EtOH or substance use during pregnancy. Significant Maternal Medications:  None Significant Maternal Lab Results: Lab values include: Group B Strep positive  No results found for this or any previous visit (from the past 24 hour(s)).  Patient Active Problem List   Diagnosis Date Noted  . Chronic  hypertension in pregnancy 08/12/2015  . GBS bacteriuria 07/09/2015  . History of delivery by vacuum extraction, currently pregnant 07/02/2015  . UTI in pregnancy, antepartum 05/22/2015  . Supervision of high risk pregnancy, antepartum 02/06/2015  . H/O cryosurgery of cervix complicating pregnancy 02/06/2015  . Pre-existing hypertension complicating pregnancy 12/21/2014  . Alcohol use disorder, severe, dependence (HCC) 09/05/2014  . Major depressive disorder, recurrent episode, moderate (HCC) 11/15/2013  . PTSD (post-traumatic stress disorder) 11/15/2013  . Anxiety state, unspecified 11/15/2013  .  Opioid dependence in remission (HCC) 11/15/2013  . Amphetamine dependence, in remission (HCC) 11/15/2013  . Tobacco use affecting pregnancy, antepartum 11/15/2013    Assessment: Haley Parks is a 30 y.o. G3P1011 at 1726w0d here for IOL for cHTN (not on medicine during pregnancy)  #Labor: cytotec q4hr #Pain: Nitrous, IV meds then Epidural as needed  #FWB: CAT-1 #ID: GBS positive. PCN #MOF: Breast  #MOC: Nexplanon #Circ: outpatient  Almon Herculesaye T Gonfa 08/12/2015, 1:18 AM  I spoke with and examined patient and agree with resident/PA/SNM's note and plan of care.  6226w0d SIUP, G3P1011 here for IOL d/t CHTN-no meds Reports +fm, denies lof, vb, some irregular uc's Cat I FHR Plan for cervical foley bulb once able Recent urine cx + for Klebsiella, sensitive to Rocephin, 1gm IVPB x 1 ordered Cheral MarkerKimberly R. Evert Wenrich, CNM, [facility]NP-[facility] 08/12/2015 4:34 AM

## 2015-08-12 NOTE — Anesthesia Preprocedure Evaluation (Signed)
Anesthesia Evaluation  Patient identified by MRN, date of birth, ID band Patient awake    Reviewed: Allergy & Precautions, NPO status , Patient's Chart, lab work & pertinent test results  History of Anesthesia Complications Negative for: history of anesthetic complications  Airway Mallampati: II  TM Distance: >3 FB Neck ROM: Full    Dental no notable dental hx. (+) Dental Advisory Given   Pulmonary former smoker,    Pulmonary exam normal        Cardiovascular hypertension, Normal cardiovascular exam     Neuro/Psych PSYCHIATRIC DISORDERS Anxiety Depression negative neurological ROS     GI/Hepatic negative GI ROS, Neg liver ROS,   Endo/Other  negative endocrine ROS  Renal/GU negative Renal ROS  negative genitourinary   Musculoskeletal negative musculoskeletal ROS (+)   Abdominal   Peds negative pediatric ROS (+)  Hematology negative hematology ROS (+)   Anesthesia Other Findings   Reproductive/Obstetrics negative OB ROS                             Anesthesia Physical Anesthesia Plan  ASA: II  Anesthesia Plan: Epidural   Post-op Pain Management:    Induction:   Airway Management Planned:   Additional Equipment:   Intra-op Plan:   Post-operative Plan:   Informed Consent: I have reviewed the patients History and Physical, chart, labs and discussed the procedure including the risks, benefits and alternatives for the proposed anesthesia with the patient or authorized representative who has indicated his/her understanding and acceptance.   Dental advisory given  Plan Discussed with: Anesthesiologist  Anesthesia Plan Comments:         Anesthesia Quick Evaluation

## 2015-08-12 NOTE — Progress Notes (Signed)
Labor Progress Note Haley PettiesLiana M Parks is a 30 y.o. G3P1011 at 4333w0d presented for IOL for cHTN. S: Inc. Pain with contractions, currently 4/10 pain. Mild nausea, no vomiting.  O:  BP 106/64 mmHg  Pulse 93  Temp(Src) 97.6 F (36.4 C) (Axillary)  Resp 18  Ht 5\' 10"  (1.778 m)  Wt 98.431 kg (217 lb)  BMI 31.14 kg/m2  LMP 11/02/2014 General: alert, cooperative and no distress Chest: normal WOB Heart: Regular rate DVT Evaluation: No evidence of DVT seen on physical exam. Extremities:  Mild edema EFM: 135/mod/+accels/variable decels  CVE: Dilation: Fingertip Effacement (%): 40 Cervical Position: Posterior Station: -3 Presentation: Vertex Exam by:: Viona GilmoreS Moyer   A&P: 30 y.o. [id number] 7533w0d with IOL due to cHTN, doing well. #Labor: Progressing well with contractions every 1-2 minutes after cytotec #Pain: Inc. Pain with contractions - Requests nitrous when 5/10 pain - Requests epidural when 7/10 pain #FWB: Category 2 strip - Frequent variables, changing positions frequently #GBS: Pos - First dose penicillin at 0117, starting second dose now  Barnie Mortolleen Ashwika Freels, Med Student 9:39 AM

## 2015-08-12 NOTE — Progress Notes (Signed)
Labor Progress Note Lowella PettiesLiana M Hilaire is a 30 y.o. G3P1011 at 8628w0d presented for IOL for cHTN S: reports mild headache. Denies vision changes or RUQ pain  O:  BP 140/95 mmHg  Pulse 79  Temp(Src) 98.2 F (36.8 C) (Oral)  Resp 16  Ht 5\' 10"  (1.778 m)  Wt 217 lb (98.431 kg)  BMI 31.14 kg/m2  SpO2 100%  LMP 11/02/2014 EFM: 120/mod var/+accels/no decels  CVE: Dilation: 4 Effacement (%): 90 Cervical Position: Posterior Station: -1 Presentation: Vertex Exam by:: Marcelino DusterMichelle, RN    A&P: 30 y.o. G3P1011 2728w0d admitted for IOL due to [place]. She had mild range BP over the last one hour. Reports mild headache.  #Mild range BP: PIH labs #Labor: continue pitocin #Pain: Epidural #FWB: CAT-1 #GBS: PCN  Almon Herculesaye T Constancia Geeting, MD 8:28 PM

## 2015-08-12 NOTE — Anesthesia Procedure Notes (Signed)
Epidural Patient location during procedure: OB Start time: 08/12/2015 2:19 PM End time: 08/12/2015 2:31 PM  Staffing Anesthesiologist: Heather RobertsSINGER, Tramane Gorum Performed by: anesthesiologist   Preanesthetic Checklist Completed: patient identified, site marked, pre-op evaluation, timeout performed, IV checked, risks and benefits discussed and monitors and equipment checked  Epidural Patient position: sitting Prep: DuraPrep Patient monitoring: heart rate, cardiac monitor, continuous pulse ox and blood pressure Approach: midline Location: L2-L3 Injection technique: LOR saline  Needle:  Needle type: Tuohy  Needle gauge: 17 G Needle length: 9 cm Needle insertion depth: 7 cm Catheter size: 20 Guage Catheter at skin depth: 12 cm Test dose: negative and Other  Assessment Events: blood not aspirated, injection not painful, no injection resistance and negative IV test  Additional Notes Informed consent obtained prior to proceeding including risk of failure, 1% risk of PDPH, risk of minor discomfort and bruising.  Discussed rare but serious complications including epidural abscess, permanent nerve injury, epidural hematoma.  Discussed alternatives to epidural analgesia and patient desires to proceed.  Timeout performed pre-procedure verifying patient name, procedure, and platelet count.  Patient tolerated procedure well.

## 2015-08-12 NOTE — Progress Notes (Signed)
Labor Progress Note Haley PettiesLiana M Parks is a 30 y.o. G3P1011 at 5351w0d presented for IOL for cHTN. S: Felling better since epidural was placed. Mild pressure with contractions. 1/10 pain at peak. No nausea/vomiting.  O:  BP 121/78 mmHg  Pulse 83  Temp(Src) 97.9 F (36.6 C) (Oral)  Resp 18  Ht 5\' 10"  (1.778 m)  Wt 98.431 kg (217 lb)  BMI 31.14 kg/m2  SpO2 100%  LMP 11/02/2014 General: alert, cooperative and no distress Chest: normal WOB Heart: Regular rate DVT Evaluation: No evidence of DVT seen on physical exam. Extremities: minimum edema EFM: 120/mod/+accels/no decels  CVE: Dilation: 1 Effacement (%): 90 Cervical Position: Posterior Station: -1 Presentation: Vertex Exam by:: Haley DusterMichelle, RN    A&P: 30 y.o. [id number] 5651w0d with IOL for cHTN, doing well. #Labor: Progressing normally #Pain: Minimal since epidural was placed #FWB: Category 1 strip #GBS: Pos - Adequately treated  Barnie Mortolleen Latondra Gebhart, Med Student 2:54 PM

## 2015-08-12 NOTE — Progress Notes (Signed)
Haley Parks is a 30 y.o. G3P1011 at 3463w0d by ultrasound admitted for induction of labor due to Hypertension.  Subjective:   Objective: BP 112/69 mmHg  Pulse 67  Temp(Src) 98 F (36.7 C) (Oral)  Resp 18  Ht 5\' 10"  (1.778 m)  Wt 217 lb (98.431 kg)  BMI 31.14 kg/m2  SpO2 100%  LMP 11/02/2014   Total I/O In: 240 [P.O.:240] Out: -   FHT:  FHR: 120-130 bpm, variability: moderate,  accelerations:  Present,  decelerations:  Absent UC:   regular, every 2 minutes SVE:   Dilation: 1 Effacement (%): 90 Station: -1 Exam by:: Haley DusterMichelle, RN   Labs: Lab Results  Component Value Date   WBC 14.5* 08/12/2015   HGB 11.9* 08/12/2015   HCT 34.8* 08/12/2015   MCV 91.1 08/12/2015   PLT 130* 08/12/2015    Assessment / Plan: Induction of labor due to [facility] conditions,  progressing well on pitocin  Labor: Progressing normally Preeclampsia:  no signs or symptoms of toxicity, intake and ouput balanced and labs stable Fetal Wellbeing:  Category I Pain Control:  Epidural I/D:  n/a Anticipated MOD:  NSVD  Haley Parks 08/12/2015, 5:04 PM

## 2015-08-12 NOTE — Progress Notes (Signed)
Patient ID: [name], female   DOB: 08/26/1985, 30 y.o.   MRN: [id number] IUP S: contractions getting stronger O: SVE 1/80/-1, AROM sm amt cl fluid. IUPC placed without difficulty. Mod variability at present with accels. A: IUP at 40 wka, IOL for CHTN. Stable maternal fetal unit P: pit induction of labor

## 2015-08-12 NOTE — Anesthesia Pain Management Evaluation Note (Signed)
  CRNA Pain Management Visit Note  Patient: Haley Parks, 30 y.o., female  "Hello I am a member of the anesthesia [facility] at [facility]. We have an anesthesia team available at all times to provide care throughout the hospital, including epidural management and anesthesia for C-section. I don't know your plan for the delivery whether it a natural birth, water birth, IV sedation, nitrous supplementation, doula or epidural, but we want to meet your pain goals."   1.Was your pain managed to your expectations on prior hospitalizations?   No   2.What is your expectation for pain management during this hospitalization?     Epidural  3.How can we help you reach that goal? epidural  Record the patient's initial score and the patient's pain goal.   Pain: 0  Pain Goal: 7 The [facility] wants you to be able to say your pain was always managed very well.  Haley Parks 08/12/2015

## 2015-08-13 ENCOUNTER — Encounter (HOSPITAL_COMMUNITY): Payer: Self-pay

## 2015-08-13 DIAGNOSIS — O99824 Streptococcus B carrier state complicating childbirth: Secondary | ICD-10-CM

## 2015-08-13 DIAGNOSIS — O1092 Unspecified pre-existing hypertension complicating childbirth: Secondary | ICD-10-CM

## 2015-08-13 DIAGNOSIS — Z3A40 40 weeks gestation of pregnancy: Secondary | ICD-10-CM

## 2015-08-13 LAB — CBC
HEMATOCRIT: 33.9 % — AB (ref 36.0–46.0)
Hemoglobin: 11.4 g/dL — ABNORMAL LOW (ref 12.0–15.0)
MCH: 30.7 pg (ref 26.0–34.0)
MCHC: 33.6 g/dL (ref 30.0–36.0)
MCV: 91.4 fL (ref 78.0–100.0)
Platelets: 123 10*3/uL — ABNORMAL LOW (ref 150–400)
RBC: 3.71 MIL/uL — ABNORMAL LOW (ref 3.87–5.11)
RDW: 13.3 % (ref 11.5–15.5)
WBC: 19.8 10*3/uL — ABNORMAL HIGH (ref 4.0–10.5)

## 2015-08-13 MED ORDER — SODIUM CHLORIDE 0.9% FLUSH: 3.0000 mL | Freq: Two times a day (BID) | INTRAVENOUS | Status: DC

## 2015-08-13 MED ORDER — MEASLES, MUMPS & RUBELLA VAC ~~LOC~~ INJ
0.5000 mL | INJECTION | Freq: Once | SUBCUTANEOUS | Status: DC
  Filled 2015-08-13: qty 0.5

## 2015-08-13 MED ORDER — SIMETHICONE 80 MG PO CHEW: 80.0000 mg | CHEWABLE_TABLET | ORAL | Status: DC | PRN

## 2015-08-13 MED ORDER — ONDANSETRON HCL 4 MG/2ML IJ SOLN: 4.0000 mg | INTRAMUSCULAR | Status: DC | PRN

## 2015-08-13 MED ORDER — IBUPROFEN 600 MG PO TABS
600.0000 mg | ORAL_TABLET | Freq: Four times a day (QID) | ORAL | Status: DC
  Administered 2015-08-13 – 2015-08-15 (×8): 600 mg via ORAL
  Filled 2015-08-13 (×9): qty 1

## 2015-08-13 MED ORDER — COCONUT OIL OIL
1.0000 "application " | TOPICAL_OIL | Status: DC | PRN
  Administered 2015-08-14: 1 via TOPICAL
  Filled 2015-08-13: qty 120

## 2015-08-13 MED ORDER — ZOLPIDEM TARTRATE 5 MG PO TABS: 5.0000 mg | ORAL_TABLET | Freq: Every evening | ORAL | Status: DC | PRN

## 2015-08-13 MED ORDER — WITCH HAZEL-GLYCERIN EX PADS: 1.0000 "application " | MEDICATED_PAD | CUTANEOUS | Status: DC | PRN

## 2015-08-13 MED ORDER — TETANUS-DIPHTH-ACELL PERTUSSIS 5-2.5-18.5 LF-MCG/0.5 IM SUSP: 0.5000 mL | Freq: Once | INTRAMUSCULAR | Status: DC

## 2015-08-13 MED ORDER — ACETAMINOPHEN 325 MG PO TABS: 650.0000 mg | ORAL_TABLET | ORAL | Status: DC | PRN

## 2015-08-13 MED ORDER — SENNOSIDES-DOCUSATE SODIUM 8.6-50 MG PO TABS
2.0000 | ORAL_TABLET | ORAL | Status: DC
  Administered 2015-08-14 (×2): 2 via ORAL
  Filled 2015-08-13: qty 2

## 2015-08-13 MED ORDER — DIBUCAINE 1 % RE OINT: 1.0000 "application " | TOPICAL_OINTMENT | RECTAL | Status: DC | PRN

## 2015-08-13 MED ORDER — PRENATAL MULTIVITAMIN CH
1.0000 | ORAL_TABLET | Freq: Every day | ORAL | Status: DC
  Administered 2015-08-13 – 2015-08-14 (×2): 1 via ORAL
  Filled 2015-08-13 (×3): qty 1

## 2015-08-13 MED ORDER — DIPHENHYDRAMINE HCL 25 MG PO CAPS: 25.0000 mg | ORAL_CAPSULE | Freq: Four times a day (QID) | ORAL | Status: DC | PRN

## 2015-08-13 MED ORDER — ONDANSETRON HCL 4 MG PO TABS: 4.0000 mg | ORAL_TABLET | ORAL | Status: DC | PRN

## 2015-08-13 MED ORDER — SODIUM CHLORIDE 0.9% FLUSH: 3.0000 mL | INTRAVENOUS | Status: DC | PRN

## 2015-08-13 MED ORDER — SODIUM CHLORIDE 0.9 % IV SOLN: 250.0000 mL | INTRAVENOUS | Status: DC | PRN

## 2015-08-13 MED ORDER — BENZOCAINE-MENTHOL 20-0.5 % EX AERO: 1.0000 "application " | INHALATION_SPRAY | CUTANEOUS | Status: DC | PRN

## 2015-08-13 NOTE — Progress Notes (Signed)
UR chart review completed.  
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
## 2015-08-13 NOTE — Anesthesia Postprocedure Evaluation (Signed)
Anesthesia Post Note  Patient: Haley PettiesLiana M Parks  Procedure(s) Performed: * No procedures listed *  Patient location during evaluation: Mother Baby Anesthesia Type: Epidural Level of consciousness: awake, awake and alert, oriented and patient cooperative Pain management: pain level controlled Vital Signs Assessment: post-procedure vital signs reviewed and stable Respiratory status: spontaneous breathing, nonlabored ventilation and respiratory function stable Cardiovascular status: stable Postop Assessment: no headache, no backache, patient able to bend at knees and no signs of nausea or vomiting Anesthetic complications: no     Last Vitals:  Filed Vitals:   08/13/15 0540 08/13/15 0615  BP: 136/85 127/72  Pulse: 92 85  Temp:  37.2 C  Resp: 16 18    Last Pain:  Filed Vitals:   08/13/15 0701  PainSc: 0-No pain   Pain Goal: Patients Stated Pain Goal: 6 (08/13/15 0615)               Chiyoko Torrico L

## 2015-08-13 NOTE — Lactation Note (Signed)
This note was copied from a baby's chart. Lactation Consultation Note: Mom reports baby has nursed twice. mom states she is having trouble getting into position. Baby spitting up small amount of mucous. Placed skin to skin  Baby back to sleep Reviewed feeding cues and encouarged to feed whenever she sees them  BF brochure given. Reviewed BFSG and OP appointments as resources for support after DC. No questions at present. Encouraged to call for assist prn  Patient Name: Haley Scarlette ShortsLiana Warning ZOXWR'UToday's Date: 08/13/2015 Reason for consult: Initial assessment   Maternal Data Formula Feeding for Exclusion: No Does the patient have breastfeeding experience prior to this delivery?: Yes  Feeding Feeding Type: Breast Fed Length of feed: 20 min  LATCH Score/Interventions Latch: Grasps breast easily, tongue down, lips flanged, rhythmical sucking.  Audible Swallowing: A few with stimulation Intervention(s): Skin to skin;Hand expression  Type of Nipple: Everted at rest and after stimulation  Comfort (Breast/Nipple): Soft / non-tender     Hold (Positioning): Assistance needed to correctly position infant at breast and maintain latch.  LATCH Score: 8  Lactation Tools Discussed/Used     Consult Status Consult Status: Follow-up Date: 08/14/15 Follow-up type: In-patient    Pamelia HoitWeeks, Adamary Savary D 08/13/2015, 9:40 AM
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
## 2015-08-14 MED ORDER — GUAIFENESIN 100 MG/5ML PO SOLN
15.0000 mL | ORAL | Status: DC | PRN
  Administered 2015-08-14: 300 mg via ORAL
  Filled 2015-08-14: qty 15

## 2015-08-14 NOTE — Progress Notes (Signed)
CSW reviewed UDS negative result with [name]. [name] did not have any questions at this time.

## 2015-08-14 NOTE — Progress Notes (Addendum)
Post Partum Day 1 Subjective:  Haley Parks is a 30 y.o. [id number] 4775w1d s/p NSVD.  No acute events overnight.  Pt denies problems with ambulating, voiding or po intake.  She denies nausea or vomiting.  Pain is well controlled. Lochia Minimal.  Plan for birth control is Nexplanon.  Method of Feeding: Breast  Objective: Blood pressure 122/80, pulse 75, temperature 97.3 F (36.3 C), temperature source Oral, resp. rate 18, height 5\' 10"  (1.778 m), weight 217 lb (98.431 kg), last menstrual period 11/02/2014, SpO2 100 %, unknown if currently breastfeeding.  Physical Exam:  General: alert, cooperative and no distress Lochia:normal flow Chest: normal WOB Heart: Regular rate Abdomen: +BS, soft, mild TTP (appropriate) Uterine Fundus: firm DVT Evaluation: No evidence of DVT seen on physical exam. Extremities: edema   Recent Labs  08/12/15 2136 08/13/15 0555  HGB 11.5* 11.4*  HCT 33.7* 33.9*    Assessment/Plan:  ASSESSMENT: Haley Parks is a 30 y.o. [id number] 375w1d s/p NSVD after induction for cHTN. BP within normal range  Plan for discharge tomorrow. Baby on phototherapy Continue routine PP care Breastfeeding support PRN  LOS: 2 days   Almon Herculesaye T Wylee Ogden 08/14/2015, 7:53 AM

## 2015-08-15 MED ORDER — ACETAMINOPHEN 325 MG PO TABS: 650.0000 mg | ORAL_TABLET | ORAL | Status: DC | PRN

## 2015-08-15 MED ORDER — IBUPROFEN 600 MG PO TABS: 600.0000 mg | ORAL_TABLET | Freq: Four times a day (QID) | ORAL | Status: DC | PRN

## 2015-08-15 MED ORDER — IBUPROFEN 600 MG PO TABS: 600.0000 mg | ORAL_TABLET | Freq: Four times a day (QID) | ORAL | Status: AC | PRN

## 2015-08-15 MED ORDER — OXYCODONE-ACETAMINOPHEN 5-325 MG PO TABS: 1.0000 | ORAL_TABLET | ORAL | Status: DC | PRN

## 2015-08-15 MED ORDER — ACETAMINOPHEN 325 MG PO TABS: 650.0000 mg | ORAL_TABLET | ORAL | Status: AC | PRN

## 2015-08-15 NOTE — Discharge Summary (Signed)
OB Discharge Summary     Patient Name: Haley Parks DOB: 02/13/1986 MRN: [id number]  Date of admission: 08/12/2015 Delivering MD: [name] D   Date of discharge: 08/15/2015  Admitting diagnosis: 40 WEEKS INDUCTION Intrauterine pregnancy: [redacted]w[redacted]d     Secondary diagnosis:  Active Problems:   Major depressive disorder, recurrent episode, moderate (HCC)   PTSD (post-traumatic stress disorder)   Anxiety state   Tobacco use affecting pregnancy, antepartum   Chronic hypertension in pregnancy   NSVD (normal spontaneous vaginal delivery)  Additional problems: none     Discharge diagnosis: Term Pregnancy Delivered                                                                                                Post partum procedures:none  Augmentation: AROM, Pitocin, Cytotec and Foley Balloon  Complications: None  Hospital course:  Induction of Labor With Vaginal Delivery   30 y.o. yo [id number] at [redacted]w[redacted]d was admitted to the hospital 08/12/2015 for induction of labor.  Indication for induction: chronic hypertension.  Patient had an uncomplicated labor course as follows: Membrane Rupture Time/Date: 10:10 AM ,08/12/2015   Intrapartum Procedures: Episiotomy: None [1]                                         Lacerations:  None [1]  Patient had delivery of a Viable infant.  Information for the patient's newborn:  Haley, Parks [[id number]]  Delivery Method: Vaginal, Spontaneous Delivery (Filed from Delivery Summary)    08/13/2015  Details of delivery can be found in separate delivery note.  Patient had a routine postpartum course. Patient is discharged home 08/15/2015.  Chronic HTN: not on any medication during pregnancy. BP within normal range postpartum without medication. Baby love ordered on discharge.  MDD/Anxiety/PTSD: stable. Not on any meds during pregnancy. Follow up in one week at [facility].  History of substance use: smoked cigarette throughout pregnancy. History of drug use prior to  pregnancy.   Physical exam  Filed Vitals:   08/13/15 1800 08/14/15 0612 08/14/15 1809 08/15/15 0621  BP: 126/86 122/80 136/81 119/77  Pulse: 75 75 73 75  Temp: 98 F (36.7 C) 97.3 F (36.3 C) 97.8 F (36.6 C) 97.9 F (36.6 C)  TempSrc: Oral Oral Oral Oral  Resp: Height:      Weight:      SpO2:       General: alert, cooperative and no distress Lochia: appropriate Uterine Fundus: firm Incision: N/A DVT Evaluation: No evidence of DVT seen on physical exam. Labs: Lab Results  Component Value Date   WBC 19.8* 08/13/2015   HGB 11.4* 08/13/2015   HCT 33.9* 08/13/2015   MCV 91.4 08/13/2015   PLT 123* 08/13/2015   CMP Latest Ref Rng 08/12/2015  Glucose 65 - 99 mg/dL 72  BUN 6 - 20 mg/dL 7  Creatinine 1.30 - 8.65 mg/dL 7.84  Sodium 696 - 295 mmol/L 136  Potassium 3.5 - 5.1 mmol/L 4.0  Chloride 101 - 111 mmol/L 106  CO2 22 - 32 mmol/L 24  Calcium 8.9 - 10.3 mg/dL 1.6(X8.4(L)  Total Protein 6.5 - 8.1 g/dL 5.9(L)  Total Bilirubin 0.3 - 1.2 mg/dL 0.7  Alkaline Phos 38 - 126 U/L 130(H)  AST 15 - 41 U/L 17  ALT 14 - 54 U/L 10(L)    Discharge instruction: per After Visit Summary and "Baby and Me Booklet".  After visit meds:    Medication List    TAKE these medications        acetaminophen 325 MG tablet  Commonly known as:  TYLENOL  Take 2 tablets (650 mg total) by mouth every 4 (four) hours as needed (mild discomfortORtemperature>/= 100.4 F).     albuterol 108 (90 Base) MCG/ACT inhaler  Commonly known as:  PROVENTIL HFA;VENTOLIN HFA  Inhale 2 puffs into the lungs every 6 (six) hours as needed for wheezing or shortness of breath.     budesonide 32 MCG/ACT nasal spray  Commonly known as:  RHINOCORT AQUA  Place 2 sprays into both nostrils daily.     gabapentin 300 MG capsule  Commonly known as:  NEURONTIN  TAKE 1 CAPSULE BY MOUTH AT BEDTIME.     ibuprofen 600 MG tablet  Commonly known as:  ADVIL,MOTRIN  Take 1 tablet (600 mg total) by mouth every 6  (six) hours as needed for moderate pain.     Prenatal Vitamins 0.8 MG tablet  Take 2 tablets by mouth daily. Gummies     ZYRTEC ALLERGY PO  Take 1 tablet by mouth daily. Reported on 07/23/2015        Diet: routine diet  Activity: Advance as tolerated. Pelvic rest for 6 weeks.   Outpatient follow up:one week   Follow-up Information    [facility].   Specialty:  Obstetrics and Gynecology   Why:  Postpartum follow up   Contact information:   [address] [place] [id number] [phone]      Postpartum contraception: nexplanon  Newborn Data: Live born female  Birth Weight: 6 lb 12.5 oz (3075 g) APGAR: 6, 9  Baby Feeding: Breast Disposition:home with mother   08/15/2015 Almon Herculesaye T Gonfa, MD   OB FELLOW DISCHARGE ATTESTATION  I have seen and examined this patient and agree with above documentation in the resident's note. bp check w/ home nursing ppd 7  Silvano BilisNoah B Burman Bruington, MD 11:24 AM \

## 2015-08-15 NOTE — Discharge Instructions (Signed)

## 2015-08-15 NOTE — Lactation Note (Signed)
This note was copied from a baby's chart. Lactation Consultation Note: Baby under phototherapy. Mom reports baby cluster fed through the night and nipples are sore. Positional stripes and bruising noted on both nipples. Has coconut oil. Baby waking. Mom pumped and bottle fed EBM at last feeding because nipples are so sore. Wants to put baby to breast with assist. Used football hold- she has been using cradle hold mostly. Encouraged mom to hold breast throughout feeding. Lots of swallows noted. Baby off to sleep after 7 min. Comfort gels given with instructions for use. No further questions at present. To call for assist prn  Patient Name: Haley Parks WUJWJ'XToday's Date: 08/15/2015 Reason for consult: Follow-up assessment   Maternal Data Formula Feeding for Exclusion: No Has patient been taught Hand Expression?: Yes  Feeding Feeding Type: Breast Fed Length of feed: 7 min  LATCH Score/Interventions Latch: Grasps breast easily, tongue down, lips flanged, rhythmical sucking.  Audible Swallowing: Spontaneous and intermittent  Type of Nipple: Everted at rest and after stimulation  Comfort (Breast/Nipple): Filling, red/small blisters or bruises, mild/mod discomfort  Problem noted: Filling Interventions (Filling): Double electric pump;Massage Interventions (Mild/moderate discomfort): Hand expression  Hold (Positioning): Assistance needed to correctly position infant at breast and maintain latch. Intervention(s): Breastfeeding basics reviewed;Position options  LATCH Score: 8  Lactation Tools Discussed/Used     Consult Status Consult Status: Follow-up Date: 08/16/15 Follow-up type: In-patient    Pamelia HoitWeeks, Mahina Salatino D 08/15/2015, 9:31 AM

## 2015-08-16 ENCOUNTER — Ambulatory Visit: Payer: Self-pay

## 2015-08-16 ENCOUNTER — Telehealth: Payer: Self-pay | Admitting: Family Medicine

## 2015-08-16 NOTE — Lactation Note (Signed)
This note was copied from a baby's chart. Lactation Consultation Note: Mother paged to assist with latch. Reviewed application of the #24 nipple shield. Infant latched on with a shallow latch and caused pain. Mother took infant off and latched infant on without the nipple shield. Observed shallow depth. Advised mother to flange infants lips. Top lip rolls under  And was difficult to relax infants jaw. Mothers milk is in . Reviewed breast compression. Infant began to suckle and observed audible swallows in a good consistent pattern. Mother states pain scale is a #2. Advised mother to frequently rotate positions from cross-cradle to football. Mother receptive to all teaching,   Patient Name: Haley Scarlette ShortsLiana Parks [id number]'s Date: 08/16/2015 Reason for consult: Follow-up assessment   Maternal Data    Feeding Feeding Type: Bottle Fed - Formula Nipple Type: Slow - flow  LATCH Score/Interventions                      Lactation Tools Discussed/Used     Consult Status Consult Status: Follow-up Date: 08/16/15 Follow-up type: In-patient    Stevan BornKendrick, Temitope Griffing [name] D [facility] 08/16/2015, 11:47 AM

## 2015-08-16 NOTE — Telephone Encounter (Signed)
Would [facility].

## 2015-08-16 NOTE — Lactation Note (Addendum)
This note was copied from a baby's chart. Lactation Consultation Note: Mother has been pumping and bottle feeding  Due to extremely sore and cracked nipples. Mother describes severe pain with feedings. Mother has been using expressed breastmilk and comfort gels. Advised mother to phone [facility] the [facility].  Mother states that she used a Nipple Shield with the last child. Discussion of using a nipple shield for painful latch. Mother to [facility] when infant is ready for the next feeding. Infant took 105 ml of ebm with the last feeding.  Discussed treatment to prevent engorgement and advised mother to be aware of Mastitis symptoms due to cracking.   Patient Name: Haley Scarlette ShortsLiana Massiah [id number]'QToday's Date: 08/16/2015 Reason for consult: Follow-up assessment   Maternal Data    Feeding Feeding Type: Bottle Fed - Formula Nipple Type: Slow - flow  LATCH Score/Interventions                      Lactation Tools Discussed/Used     Consult Status Consult Status: Follow-up Date: 08/16/15 Follow-up type: In-patient    Stevan BornKendrick, Andriy Sherk [facility] 08/16/2015, 10:38 AM

## 2015-08-21 ENCOUNTER — Other Ambulatory Visit: Payer: Self-pay | Admitting: Family Medicine

## 2015-08-21 MED ORDER — NIFEDIPINE ER OSMOTIC RELEASE 30 MG PO TB24: 30.0000 mg | ORAL_TABLET | Freq: Every day | ORAL | Status: AC

## 2015-10-15 ENCOUNTER — Ambulatory Visit: Payer: Self-pay | Admitting: Obstetrics & Gynecology
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
